# Patient Record
Sex: Male | Born: 1955 | Race: White | Hispanic: No | Marital: Married | Smoking: Current every day smoker
Health system: Southern US, Community
[De-identification: ages and names within clinical notes are randomized; demographics above are authoritative.]

## PROBLEM LIST (undated history)

## (undated) DIAGNOSIS — M1990 Unspecified osteoarthritis, unspecified site: Secondary | ICD-10-CM

## (undated) DIAGNOSIS — Z87891 Personal history of nicotine dependence: Principal | ICD-10-CM

## (undated) DIAGNOSIS — J439 Emphysema, unspecified: Secondary | ICD-10-CM

## (undated) DIAGNOSIS — M4800 Spinal stenosis, site unspecified: Secondary | ICD-10-CM

## (undated) HISTORY — PX: ESOPHAGOGASTRODUODENOSCOPY: SHX1529

## (undated) HISTORY — DX: Emphysema, unspecified: J43.9

## (undated) HISTORY — PX: BACK SURGERY: SHX140

## (undated) HISTORY — DX: Hemochromatosis, unspecified: E83.119

## (undated) HISTORY — DX: Unspecified osteoarthritis, unspecified site: M19.90

## (undated) HISTORY — DX: Spinal stenosis, site unspecified: M48.00

## (undated) HISTORY — DX: Personal history of nicotine dependence: Z87.891

## (undated) HISTORY — PX: COLONOSCOPY: SHX174

---

## 2000-02-17 ENCOUNTER — Emergency Department (HOSPITAL_COMMUNITY): Admission: EM | Admit: 2000-02-17 | Discharge: 2000-02-17 | Payer: Self-pay | Admitting: Emergency Medicine

## 2004-04-05 ENCOUNTER — Other Ambulatory Visit: Payer: Self-pay

## 2004-08-10 ENCOUNTER — Encounter: Payer: Self-pay | Admitting: Unknown Physician Specialty

## 2004-08-23 ENCOUNTER — Ambulatory Visit: Payer: Self-pay | Admitting: Internal Medicine

## 2004-09-05 ENCOUNTER — Encounter: Payer: Self-pay | Admitting: Unknown Physician Specialty

## 2004-09-05 ENCOUNTER — Ambulatory Visit: Payer: Self-pay | Admitting: Internal Medicine

## 2005-01-16 ENCOUNTER — Ambulatory Visit: Payer: Self-pay | Admitting: General Surgery

## 2005-01-21 ENCOUNTER — Ambulatory Visit: Payer: Self-pay | Admitting: Internal Medicine

## 2005-02-02 ENCOUNTER — Ambulatory Visit: Payer: Self-pay | Admitting: Internal Medicine

## 2005-03-05 ENCOUNTER — Ambulatory Visit: Payer: Self-pay | Admitting: Internal Medicine

## 2005-04-05 ENCOUNTER — Ambulatory Visit: Payer: Self-pay | Admitting: Internal Medicine

## 2005-05-05 ENCOUNTER — Ambulatory Visit: Payer: Self-pay | Admitting: Internal Medicine

## 2005-06-05 ENCOUNTER — Ambulatory Visit: Payer: Self-pay | Admitting: Internal Medicine

## 2005-07-05 ENCOUNTER — Ambulatory Visit: Payer: Self-pay | Admitting: Internal Medicine

## 2005-08-05 ENCOUNTER — Ambulatory Visit: Payer: Self-pay | Admitting: Internal Medicine

## 2005-09-05 ENCOUNTER — Ambulatory Visit: Payer: Self-pay | Admitting: Internal Medicine

## 2005-10-03 ENCOUNTER — Ambulatory Visit: Payer: Self-pay | Admitting: Internal Medicine

## 2005-11-03 ENCOUNTER — Ambulatory Visit: Payer: Self-pay | Admitting: Internal Medicine

## 2005-12-03 ENCOUNTER — Ambulatory Visit: Payer: Self-pay | Admitting: Internal Medicine

## 2006-01-03 ENCOUNTER — Ambulatory Visit: Payer: Self-pay | Admitting: Internal Medicine

## 2006-02-02 ENCOUNTER — Ambulatory Visit: Payer: Self-pay | Admitting: Internal Medicine

## 2006-02-02 ENCOUNTER — Emergency Department: Payer: Self-pay | Admitting: General Practice

## 2006-02-07 ENCOUNTER — Ambulatory Visit: Payer: Self-pay | Admitting: Physician Assistant

## 2006-03-05 ENCOUNTER — Ambulatory Visit: Payer: Self-pay | Admitting: Internal Medicine

## 2006-04-05 ENCOUNTER — Ambulatory Visit: Payer: Self-pay | Admitting: Internal Medicine

## 2006-05-05 ENCOUNTER — Ambulatory Visit: Payer: Self-pay | Admitting: Internal Medicine

## 2006-05-27 ENCOUNTER — Ambulatory Visit: Payer: Self-pay | Admitting: Cardiovascular Disease

## 2006-06-05 ENCOUNTER — Ambulatory Visit: Payer: Self-pay | Admitting: Internal Medicine

## 2006-06-10 ENCOUNTER — Inpatient Hospital Stay: Payer: Self-pay | Admitting: Otolaryngology

## 2006-06-10 ENCOUNTER — Other Ambulatory Visit: Payer: Self-pay

## 2006-07-05 ENCOUNTER — Ambulatory Visit: Payer: Self-pay | Admitting: Internal Medicine

## 2006-07-18 ENCOUNTER — Ambulatory Visit (HOSPITAL_COMMUNITY): Admission: RE | Admit: 2006-07-18 | Discharge: 2006-07-19 | Payer: Self-pay | Admitting: Otolaryngology

## 2006-07-19 ENCOUNTER — Ambulatory Visit (HOSPITAL_COMMUNITY): Admission: RE | Admit: 2006-07-19 | Discharge: 2006-07-19 | Payer: Self-pay | Admitting: Otolaryngology

## 2006-08-05 ENCOUNTER — Ambulatory Visit: Payer: Self-pay | Admitting: Internal Medicine

## 2006-09-05 ENCOUNTER — Ambulatory Visit: Payer: Self-pay | Admitting: Internal Medicine

## 2006-10-04 ENCOUNTER — Ambulatory Visit: Payer: Self-pay | Admitting: Internal Medicine

## 2006-11-04 ENCOUNTER — Ambulatory Visit: Payer: Self-pay | Admitting: Internal Medicine

## 2006-11-10 ENCOUNTER — Ambulatory Visit: Payer: Self-pay | Admitting: Internal Medicine

## 2006-12-04 ENCOUNTER — Ambulatory Visit: Payer: Self-pay | Admitting: Internal Medicine

## 2007-01-04 ENCOUNTER — Ambulatory Visit: Payer: Self-pay | Admitting: Internal Medicine

## 2007-02-03 ENCOUNTER — Ambulatory Visit: Payer: Self-pay | Admitting: Internal Medicine

## 2007-03-06 ENCOUNTER — Ambulatory Visit: Payer: Self-pay | Admitting: Internal Medicine

## 2007-03-09 ENCOUNTER — Ambulatory Visit: Payer: Self-pay | Admitting: Internal Medicine

## 2007-04-06 ENCOUNTER — Ambulatory Visit: Payer: Self-pay | Admitting: Internal Medicine

## 2007-05-06 ENCOUNTER — Ambulatory Visit: Payer: Self-pay | Admitting: Internal Medicine

## 2007-06-06 ENCOUNTER — Ambulatory Visit: Payer: Self-pay | Admitting: Internal Medicine

## 2007-06-11 ENCOUNTER — Ambulatory Visit: Payer: Self-pay | Admitting: Internal Medicine

## 2007-07-06 ENCOUNTER — Ambulatory Visit: Payer: Self-pay | Admitting: Internal Medicine

## 2007-08-06 ENCOUNTER — Ambulatory Visit: Payer: Self-pay | Admitting: Internal Medicine

## 2007-09-06 ENCOUNTER — Ambulatory Visit: Payer: Self-pay | Admitting: Internal Medicine

## 2007-10-04 ENCOUNTER — Ambulatory Visit: Payer: Self-pay | Admitting: Internal Medicine

## 2007-11-04 ENCOUNTER — Ambulatory Visit: Payer: Self-pay | Admitting: Internal Medicine

## 2007-12-04 ENCOUNTER — Ambulatory Visit: Payer: Self-pay | Admitting: Internal Medicine

## 2008-01-04 ENCOUNTER — Ambulatory Visit: Payer: Self-pay | Admitting: Internal Medicine

## 2008-02-03 ENCOUNTER — Ambulatory Visit: Payer: Self-pay | Admitting: Internal Medicine

## 2008-03-05 ENCOUNTER — Ambulatory Visit: Payer: Self-pay | Admitting: Internal Medicine

## 2008-04-05 ENCOUNTER — Ambulatory Visit: Payer: Self-pay | Admitting: Internal Medicine

## 2008-05-05 ENCOUNTER — Ambulatory Visit: Payer: Self-pay | Admitting: Internal Medicine

## 2008-06-05 ENCOUNTER — Ambulatory Visit: Payer: Self-pay | Admitting: Internal Medicine

## 2008-07-05 ENCOUNTER — Ambulatory Visit: Payer: Self-pay | Admitting: Internal Medicine

## 2008-08-05 ENCOUNTER — Ambulatory Visit: Payer: Self-pay | Admitting: Internal Medicine

## 2008-09-15 ENCOUNTER — Ambulatory Visit: Payer: Self-pay | Admitting: Internal Medicine

## 2008-10-03 ENCOUNTER — Ambulatory Visit: Payer: Self-pay | Admitting: Internal Medicine

## 2008-11-03 ENCOUNTER — Ambulatory Visit: Payer: Self-pay | Admitting: Internal Medicine

## 2008-12-03 ENCOUNTER — Ambulatory Visit: Payer: Self-pay | Admitting: Internal Medicine

## 2008-12-15 ENCOUNTER — Ambulatory Visit: Payer: Self-pay | Admitting: Internal Medicine

## 2009-01-03 ENCOUNTER — Ambulatory Visit: Payer: Self-pay | Admitting: Internal Medicine

## 2009-02-02 ENCOUNTER — Ambulatory Visit: Payer: Self-pay | Admitting: Internal Medicine

## 2009-03-05 ENCOUNTER — Ambulatory Visit: Payer: Self-pay | Admitting: Internal Medicine

## 2009-04-05 ENCOUNTER — Ambulatory Visit: Payer: Self-pay | Admitting: Internal Medicine

## 2009-04-13 ENCOUNTER — Ambulatory Visit: Payer: Self-pay | Admitting: Internal Medicine

## 2009-05-05 ENCOUNTER — Ambulatory Visit: Payer: Self-pay | Admitting: Internal Medicine

## 2009-06-05 ENCOUNTER — Ambulatory Visit: Payer: Self-pay | Admitting: Internal Medicine

## 2009-07-05 ENCOUNTER — Ambulatory Visit: Payer: Self-pay | Admitting: Internal Medicine

## 2009-08-05 ENCOUNTER — Ambulatory Visit: Payer: Self-pay | Admitting: Internal Medicine

## 2009-09-05 ENCOUNTER — Ambulatory Visit: Payer: Self-pay | Admitting: Internal Medicine

## 2009-10-31 ENCOUNTER — Ambulatory Visit: Payer: Self-pay | Admitting: Internal Medicine

## 2009-11-03 ENCOUNTER — Ambulatory Visit: Payer: Self-pay | Admitting: Internal Medicine

## 2009-12-03 ENCOUNTER — Ambulatory Visit: Payer: Self-pay | Admitting: Internal Medicine

## 2010-01-03 ENCOUNTER — Ambulatory Visit: Payer: Self-pay | Admitting: Internal Medicine

## 2010-01-06 ENCOUNTER — Emergency Department: Payer: Self-pay | Admitting: Emergency Medicine

## 2010-01-08 ENCOUNTER — Inpatient Hospital Stay: Payer: Self-pay | Admitting: Internal Medicine

## 2010-02-02 ENCOUNTER — Ambulatory Visit: Payer: Self-pay | Admitting: Internal Medicine

## 2010-04-05 ENCOUNTER — Ambulatory Visit: Payer: Self-pay | Admitting: Internal Medicine

## 2010-04-30 ENCOUNTER — Ambulatory Visit: Payer: Self-pay | Admitting: Unknown Physician Specialty

## 2010-05-05 ENCOUNTER — Ambulatory Visit: Payer: Self-pay | Admitting: Internal Medicine

## 2010-06-08 ENCOUNTER — Ambulatory Visit: Payer: Self-pay | Admitting: Internal Medicine

## 2010-07-05 ENCOUNTER — Ambulatory Visit: Payer: Self-pay | Admitting: Internal Medicine

## 2010-08-05 ENCOUNTER — Ambulatory Visit: Payer: Self-pay | Admitting: Internal Medicine

## 2010-09-05 ENCOUNTER — Ambulatory Visit: Payer: Self-pay | Admitting: Internal Medicine

## 2010-09-21 ENCOUNTER — Emergency Department: Payer: Self-pay | Admitting: Emergency Medicine

## 2010-10-04 ENCOUNTER — Ambulatory Visit: Payer: Self-pay | Admitting: Internal Medicine

## 2010-11-04 ENCOUNTER — Ambulatory Visit: Payer: Self-pay | Admitting: Internal Medicine

## 2010-12-04 ENCOUNTER — Ambulatory Visit: Payer: Self-pay | Admitting: Internal Medicine

## 2011-01-04 ENCOUNTER — Ambulatory Visit: Payer: Self-pay | Admitting: Internal Medicine

## 2011-02-15 ENCOUNTER — Ambulatory Visit: Payer: Self-pay | Admitting: Internal Medicine

## 2011-03-06 ENCOUNTER — Ambulatory Visit: Payer: Self-pay | Admitting: Internal Medicine

## 2011-04-06 ENCOUNTER — Ambulatory Visit: Payer: Self-pay | Admitting: Internal Medicine

## 2011-05-09 ENCOUNTER — Ambulatory Visit: Payer: Self-pay | Admitting: Internal Medicine

## 2011-06-06 ENCOUNTER — Ambulatory Visit: Payer: Self-pay | Admitting: Internal Medicine

## 2011-07-11 ENCOUNTER — Ambulatory Visit: Payer: Self-pay | Admitting: Internal Medicine

## 2011-08-06 ENCOUNTER — Ambulatory Visit: Payer: Self-pay | Admitting: Internal Medicine

## 2011-09-06 ENCOUNTER — Ambulatory Visit: Payer: Self-pay | Admitting: Internal Medicine

## 2011-09-06 LAB — CANCER CENTER HEMOGLOBIN: HGB: 13.1 g/dL (ref 13.0–18.0)

## 2011-10-04 ENCOUNTER — Ambulatory Visit: Payer: Self-pay | Admitting: Internal Medicine

## 2011-10-05 LAB — AFP TUMOR MARKER: AFP-Tumor Marker: 0.8 ng/mL (ref 0.0–8.3)

## 2011-11-04 ENCOUNTER — Ambulatory Visit: Payer: Self-pay | Admitting: Internal Medicine

## 2011-11-12 LAB — FERRITIN: Ferritin (ARMC): 10 ng/mL (ref 8–388)

## 2011-11-29 LAB — CBC CANCER CENTER
Basophil #: 0 "x10 3/mm "
Basophil %: 0.7 %
Eosinophil #: 0.2 "x10 3/mm "
Eosinophil %: 3.4 %
HCT: 38.3 % — ABNORMAL LOW
HGB: 12.5 g/dL — ABNORMAL LOW
Lymphocyte %: 39.6 %
Lymphs Abs: 2.3 "x10 3/mm "
MCH: 29.2 pg
MCHC: 32.6 g/dL
MCV: 90 fL
Monocyte #: 0.4 "x10 3/mm "
Monocyte %: 6.5 %
Neutrophil #: 2.9 "x10 3/mm "
Neutrophil %: 49.8 %
Platelet: 188 "x10 3/mm "
RBC: 4.28 "x10 6/mm " — ABNORMAL LOW
RDW: 15.7 % — ABNORMAL HIGH
WBC: 5.8 "x10 3/mm "

## 2011-12-04 ENCOUNTER — Ambulatory Visit: Payer: Self-pay | Admitting: Internal Medicine

## 2011-12-27 LAB — CBC CANCER CENTER
Basophil %: 0.5 %
HGB: 12.7 g/dL — ABNORMAL LOW (ref 13.0–18.0)
Monocyte %: 7.8 %
Neutrophil %: 41.1 %
RDW: 15.9 % — ABNORMAL HIGH (ref 11.5–14.5)

## 2012-01-04 ENCOUNTER — Ambulatory Visit: Payer: Self-pay | Admitting: Internal Medicine

## 2012-01-24 LAB — CANCER CENTER HEMOGLOBIN: HGB: 11.9 g/dL — ABNORMAL LOW (ref 13.0–18.0)

## 2012-01-24 LAB — FERRITIN: Ferritin (ARMC): 12 ng/mL (ref 8–388)

## 2012-02-03 ENCOUNTER — Ambulatory Visit: Payer: Self-pay | Admitting: Internal Medicine

## 2012-04-10 ENCOUNTER — Ambulatory Visit: Payer: Self-pay | Admitting: Internal Medicine

## 2012-04-10 LAB — FERRITIN: Ferritin (ARMC): 9 ng/mL (ref 8–388)

## 2012-05-05 ENCOUNTER — Ambulatory Visit: Payer: Self-pay | Admitting: Internal Medicine

## 2012-05-21 ENCOUNTER — Ambulatory Visit: Payer: Self-pay

## 2012-05-25 ENCOUNTER — Emergency Department: Payer: Self-pay | Admitting: Emergency Medicine

## 2012-05-25 LAB — COMPREHENSIVE METABOLIC PANEL
Alkaline Phosphatase: 96 U/L (ref 50–136)
Bilirubin,Total: 0.7 mg/dL (ref 0.2–1.0)
Co2: 24 mmol/L (ref 21–32)
Creatinine: 0.87 mg/dL (ref 0.60–1.30)
Osmolality: 278 (ref 275–301)
Sodium: 140 mmol/L (ref 136–145)

## 2012-05-25 LAB — CBC
HGB: 14.7 g/dL (ref 13.0–18.0)
MCHC: 34.6 g/dL (ref 32.0–36.0)
RDW: 16.7 % — ABNORMAL HIGH (ref 11.5–14.5)

## 2012-06-05 ENCOUNTER — Ambulatory Visit: Payer: Self-pay

## 2012-06-05 ENCOUNTER — Ambulatory Visit: Payer: Self-pay | Admitting: Internal Medicine

## 2012-06-05 LAB — FERRITIN: Ferritin (ARMC): 12 ng/mL (ref 8–388)

## 2012-06-29 ENCOUNTER — Ambulatory Visit: Payer: Self-pay | Admitting: Unknown Physician Specialty

## 2012-07-01 LAB — PATHOLOGY REPORT

## 2012-07-05 ENCOUNTER — Ambulatory Visit: Payer: Self-pay | Admitting: Internal Medicine

## 2012-07-17 LAB — CANCER CENTER HEMOGLOBIN: HGB: 13.3 g/dL (ref 13.0–18.0)

## 2012-08-05 ENCOUNTER — Ambulatory Visit: Payer: Self-pay | Admitting: Internal Medicine

## 2012-09-05 ENCOUNTER — Ambulatory Visit: Payer: Self-pay | Admitting: Internal Medicine

## 2012-10-03 ENCOUNTER — Ambulatory Visit: Payer: Self-pay | Admitting: Internal Medicine

## 2012-11-23 ENCOUNTER — Ambulatory Visit: Payer: Self-pay | Admitting: Internal Medicine

## 2012-11-26 LAB — FERRITIN: Ferritin (ARMC): 11 ng/mL (ref 8–388)

## 2012-11-26 LAB — CANCER CENTER HEMOGLOBIN: HGB: 12.7 g/dL — ABNORMAL LOW (ref 13.0–18.0)

## 2012-12-03 ENCOUNTER — Ambulatory Visit: Payer: Self-pay | Admitting: Internal Medicine

## 2013-01-03 ENCOUNTER — Ambulatory Visit: Payer: Self-pay | Admitting: Internal Medicine

## 2013-02-02 ENCOUNTER — Ambulatory Visit: Payer: Self-pay | Admitting: Internal Medicine

## 2013-03-05 ENCOUNTER — Ambulatory Visit: Payer: Self-pay | Admitting: Internal Medicine

## 2013-03-26 LAB — CANCER CENTER HEMOGLOBIN: HGB: 13.1 g/dL (ref 13.0–18.0)

## 2013-04-05 ENCOUNTER — Ambulatory Visit: Payer: Self-pay | Admitting: Internal Medicine

## 2013-05-07 ENCOUNTER — Ambulatory Visit: Payer: Self-pay | Admitting: Internal Medicine

## 2013-06-05 ENCOUNTER — Ambulatory Visit: Payer: Self-pay | Admitting: Internal Medicine

## 2013-06-18 LAB — CANCER CENTER HEMOGLOBIN: HGB: 13.3 g/dL (ref 13.0–18.0)

## 2013-06-18 IMAGING — CR DG KNEE COMPLETE 4+V*L*
1 series · 4 of 4 positions shown · non-contrast
Comparison: none

REASON FOR EXAM: pain, arthritis, Dr JIM.Kechyk [phone]
COMMENTS:

[Series 2: t knee ap left · 0.14mm/px · 4 of 4 slices shown]
[im 1/4]
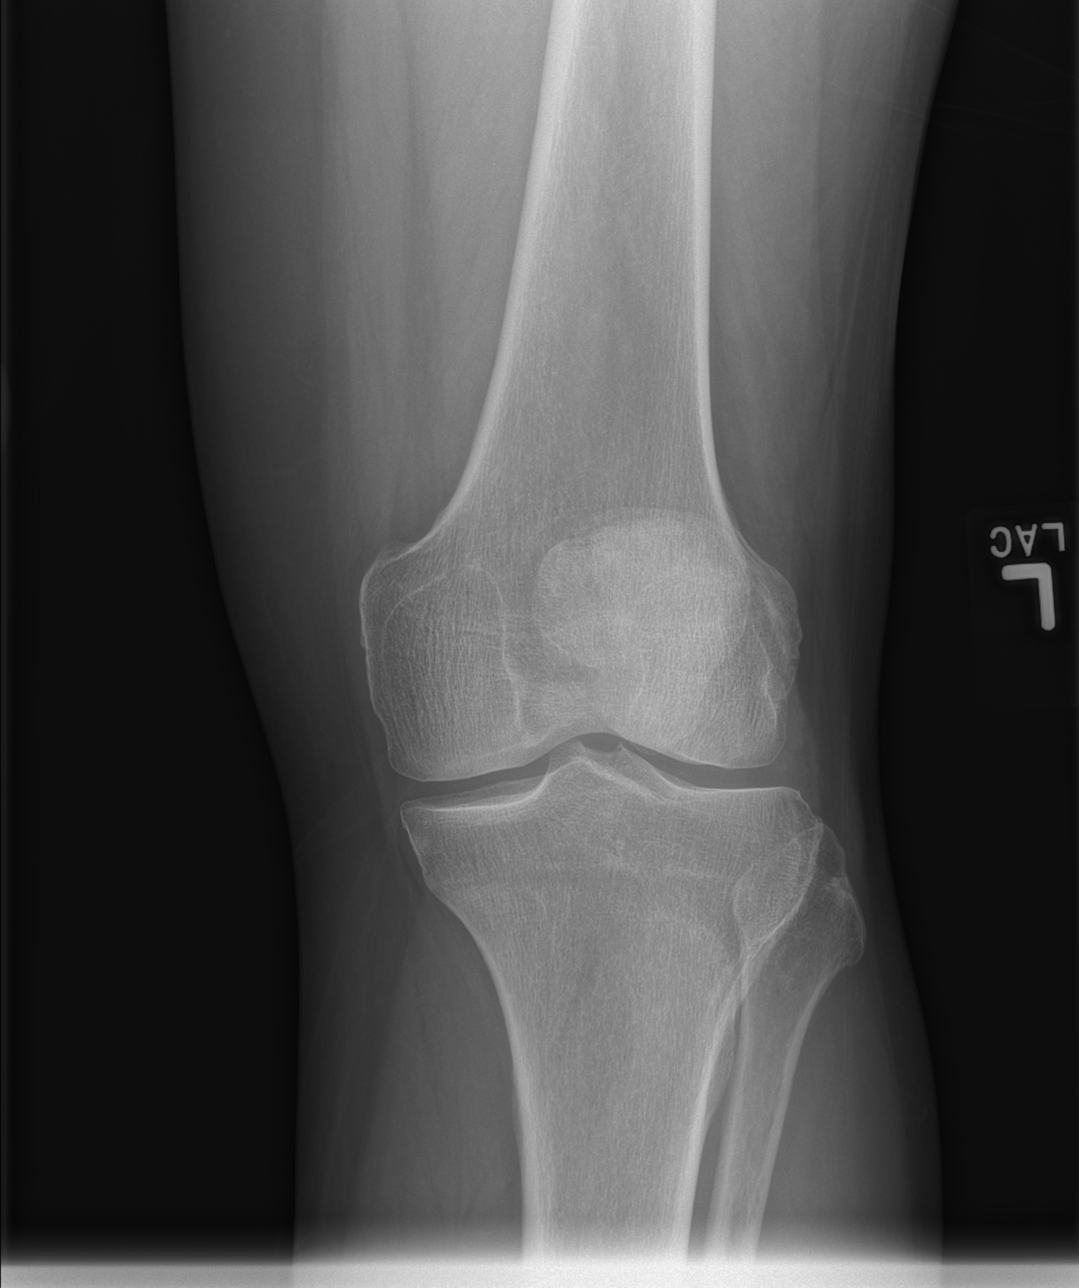
[im 2/4]
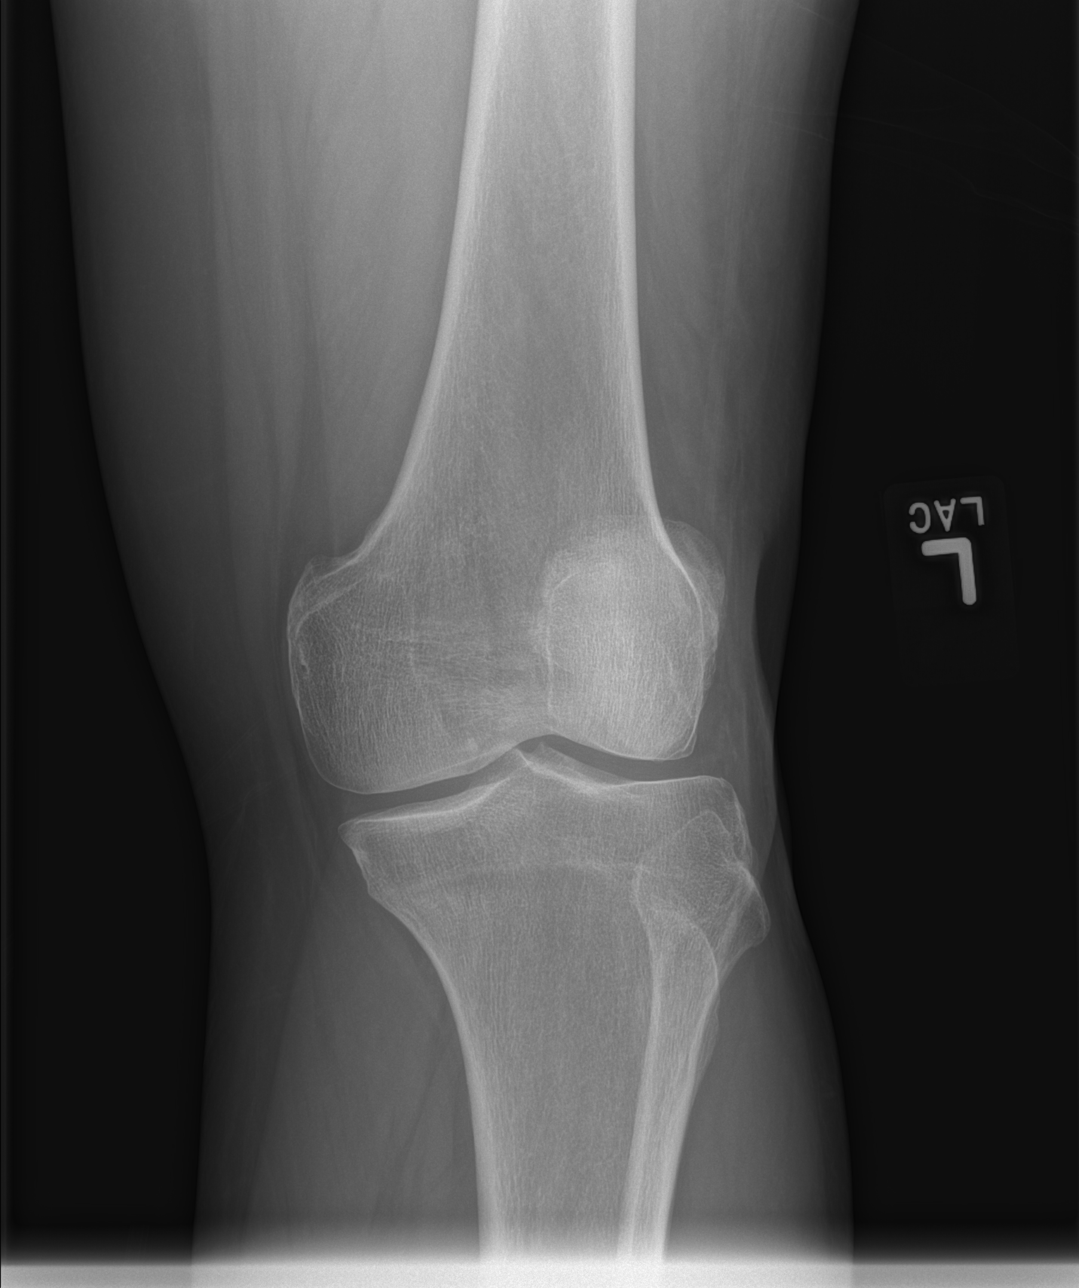
[im 3/4]
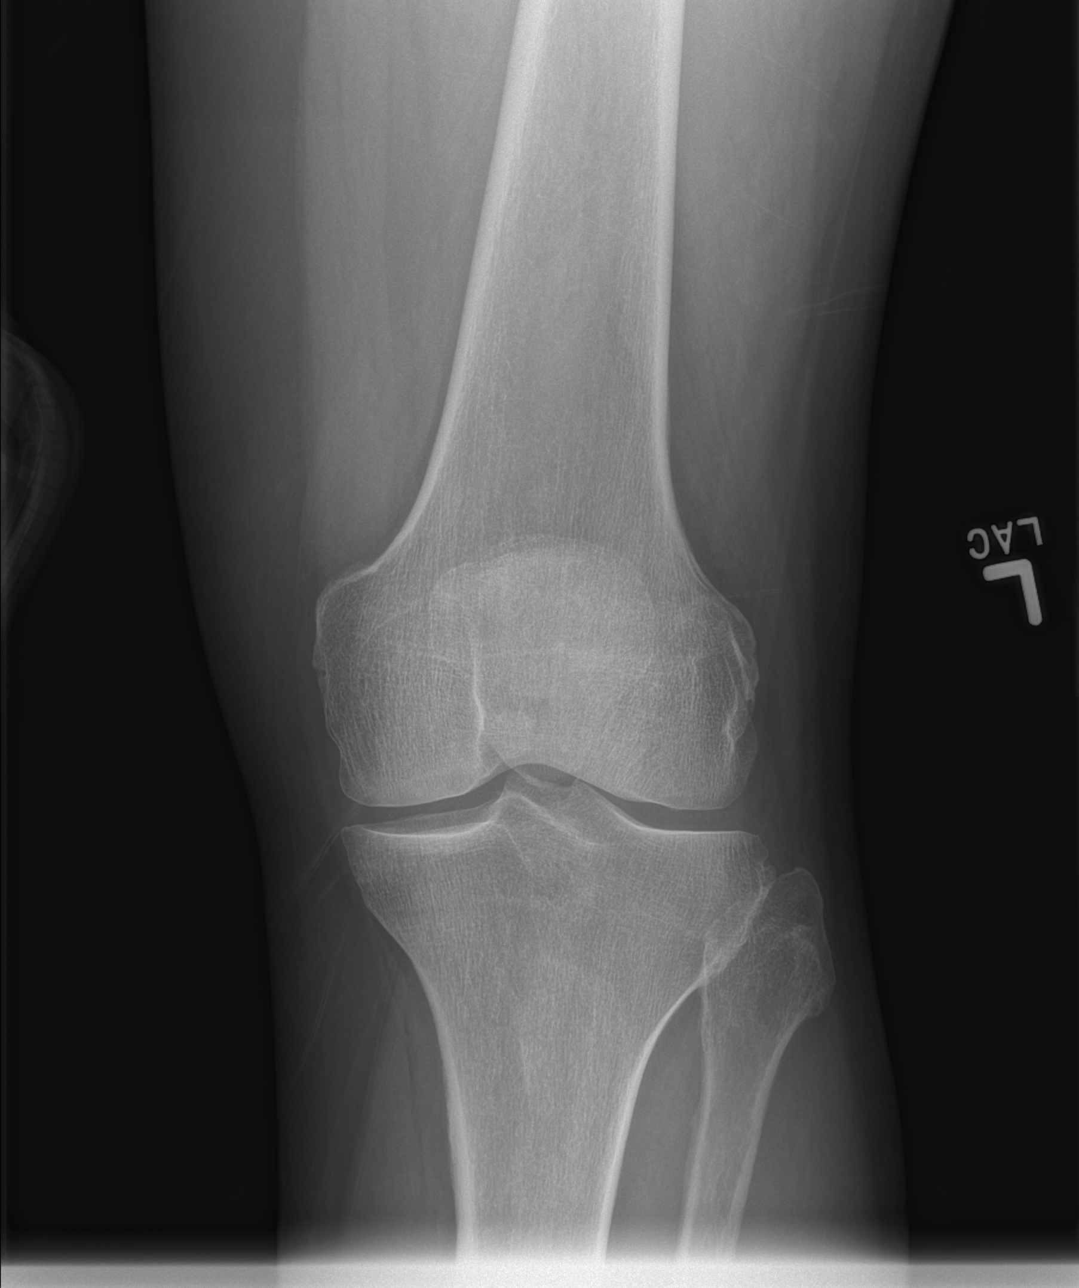
[im 4/4]
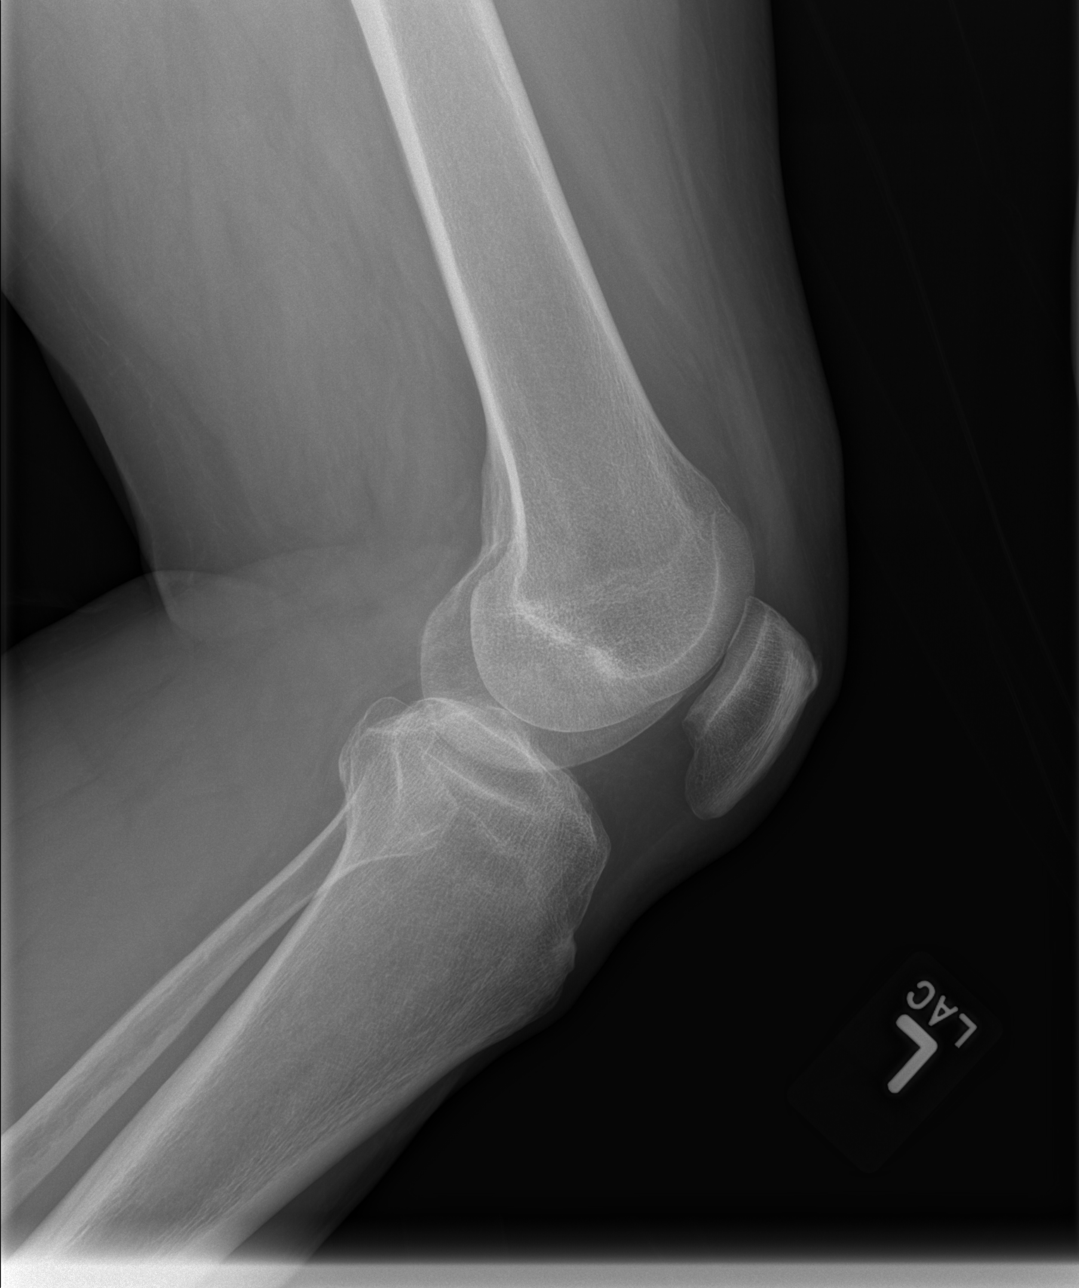

[4 of 4 positions shown; findings below may reference images not displayed]

PROCEDURE:     DXR - DXR KNEE LT COMP WITH OBLIQUES  - May 21, 2012 [DATE]

RESULT:     Four views of the left knee reveal the bones to be reasonably
well mineralized. There is minimal beaking of the tibial spines. The joint
spaces do not appear abnormally narrowed. No significant osteophyte
formation is demonstrated. The overlying soft tissues are normal in
appearance.
IMPRESSION: There is no acute bony abnormality of the left knee nor
evidence of more than minimal degenerative change.

If internal derangement is suspected, further evaluation with MRI would be a
useful next step.

[REDACTED]

## 2013-06-22 IMAGING — CR DG CHEST 1V
1 series · 1 of 1 positions shown · non-contrast
Comparison: none

REASON FOR EXAM: sob
COMMENTS:

[w chest pa]
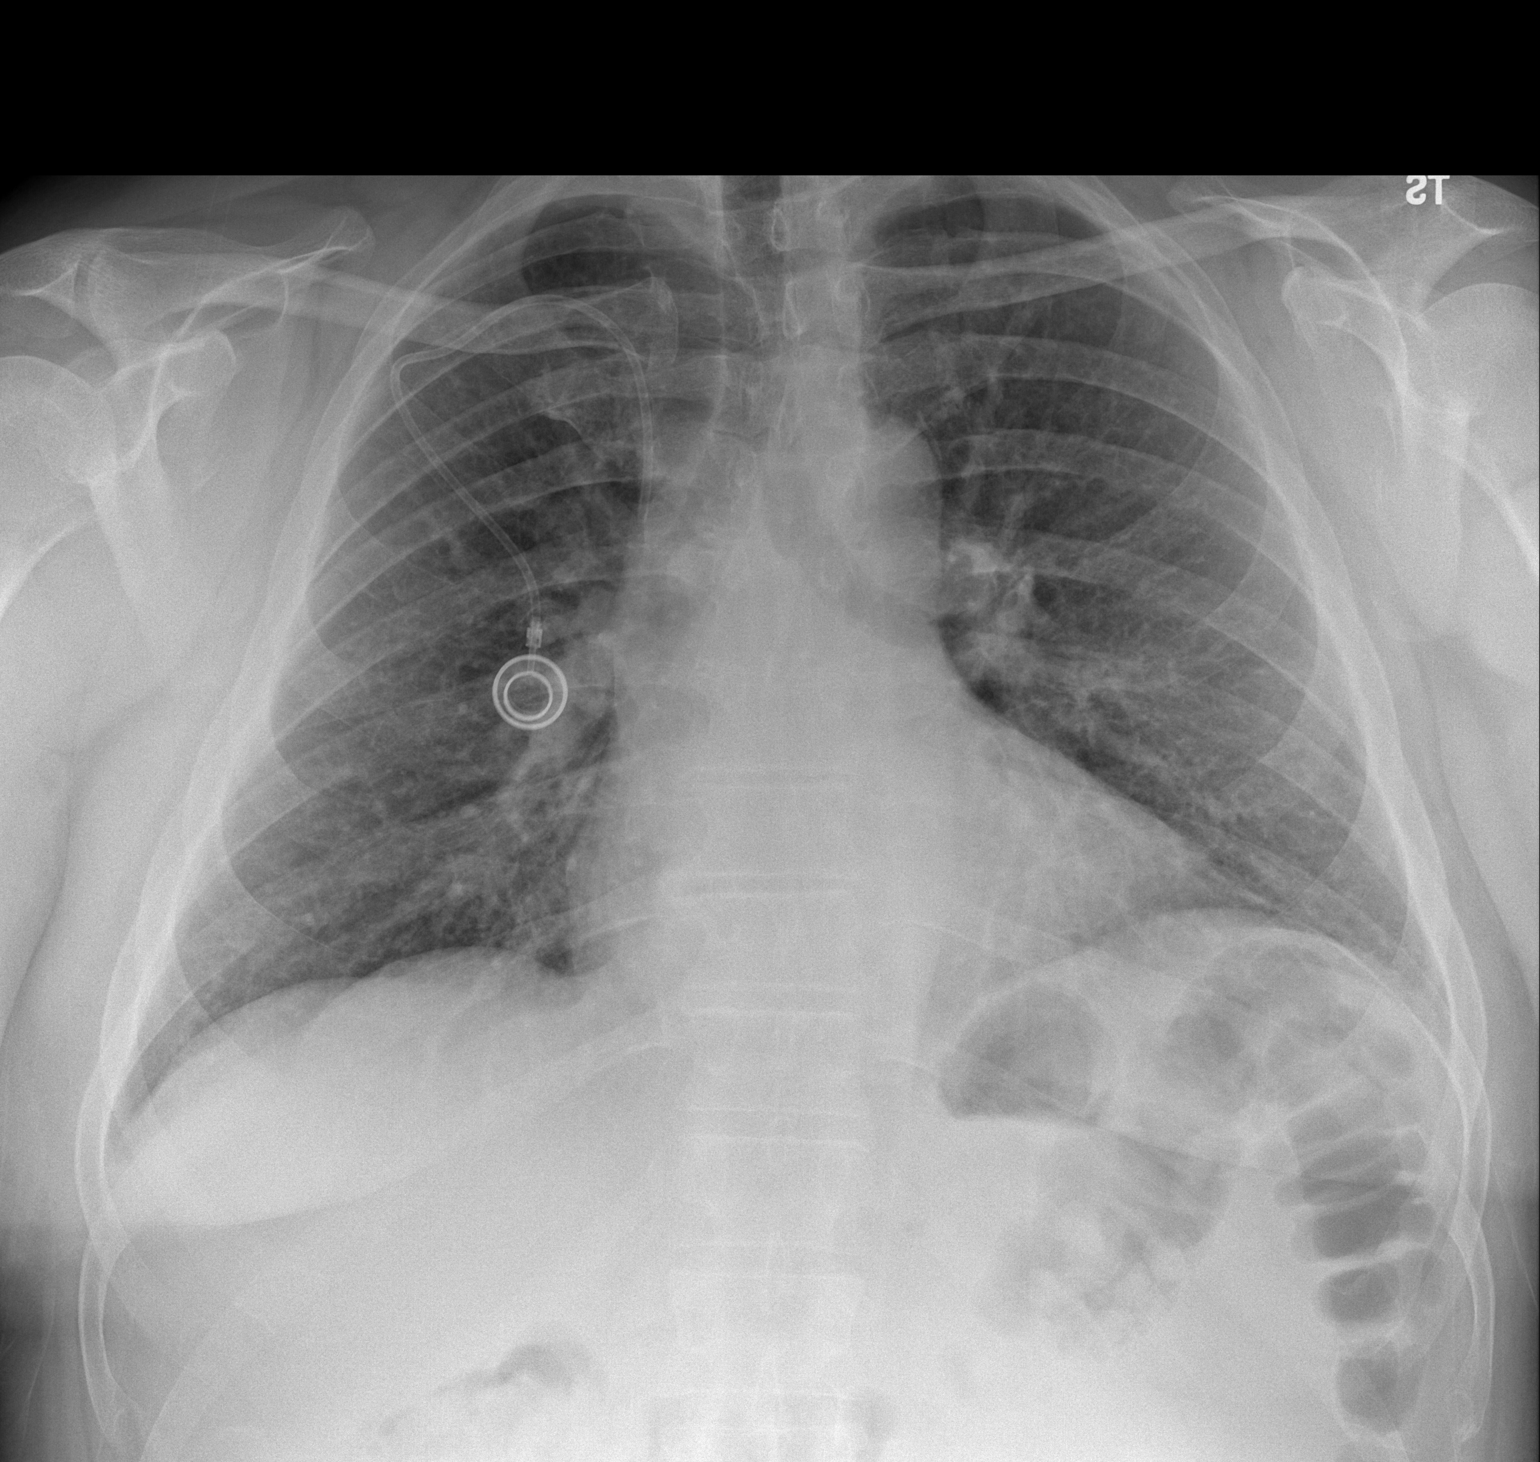

[1 of 1 positions shown; findings below may reference images not displayed]

PROCEDURE:     DXR - DXR CHEST 1 VIEWAP OR PA  - May 25, 2012  [DATE]

RESULT:     Comparison is made to the study January 08, 2010.

The lungs are adequately inflated. The interstitial markings are mildly
prominent. The cardiac silhouette is top normal in size. The pulmonary
vascularity is slightly more conspicuous today centrally. There is a
Port-A-Cath appliance in place. There is no pleural effusion or pneumothorax
or alveolar infiltrate.
IMPRESSION: Mildly increased interstitial markings may reflect
interstitial edema of cardiac or noncardiac cause. There is no focal
pneumonia.

[REDACTED]

## 2013-06-22 IMAGING — CT CT HEAD WITHOUT CONTRAST
2 series · 16 of 30 positions shown, 20 images · non-contrast
Comparison: none

REASON FOR EXAM: severe headache
COMMENTS:

PROCEDURE:     CT  - CT HEAD WITHOUT CONTRAST  - May 25, 2012  [DATE]
RESULT:     History: Headache.
Comparison Study: No recent.

[Series 2: without · axial · non-contrast · 0.42mm/px · z∈[+67,+187]mm · 13 of 30 slices shown, 17 images]
[im 3/30  brain]
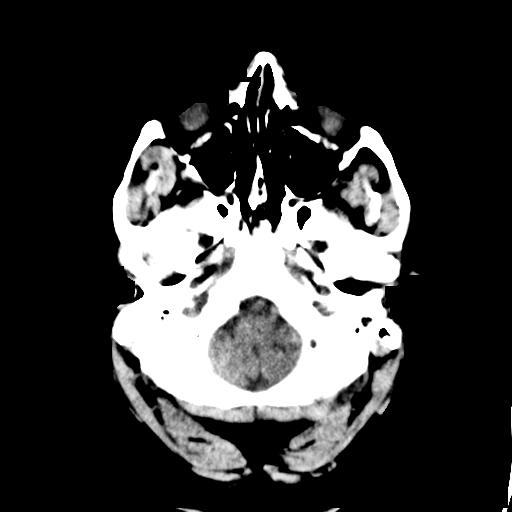
[im 3/30  bone]
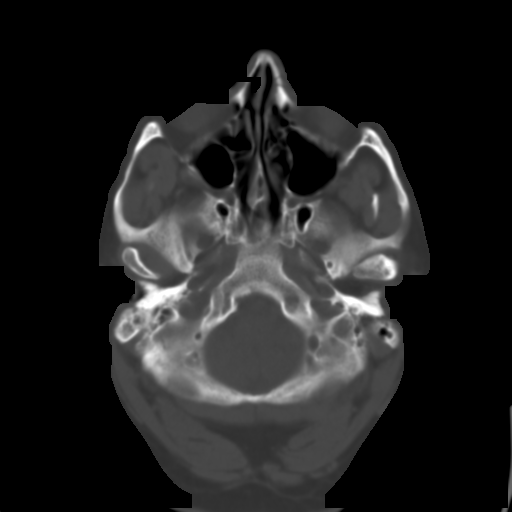
[im 5/30  brain]
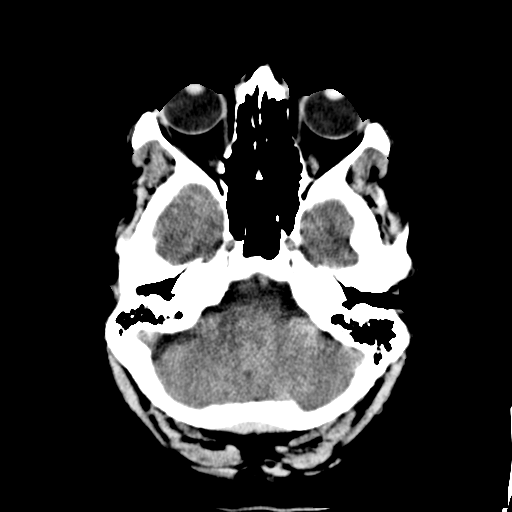
[im 7/30  brain]
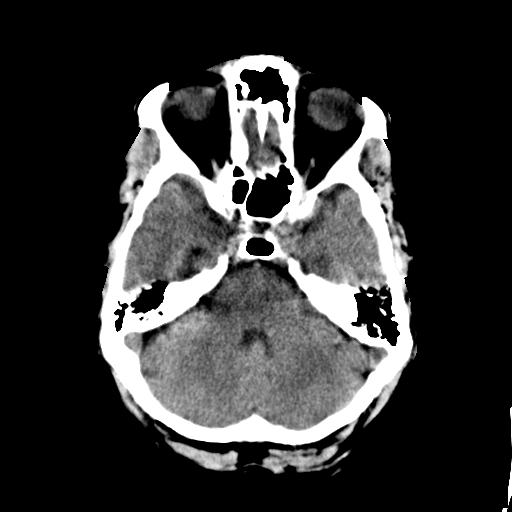
[im 9/30  brain]
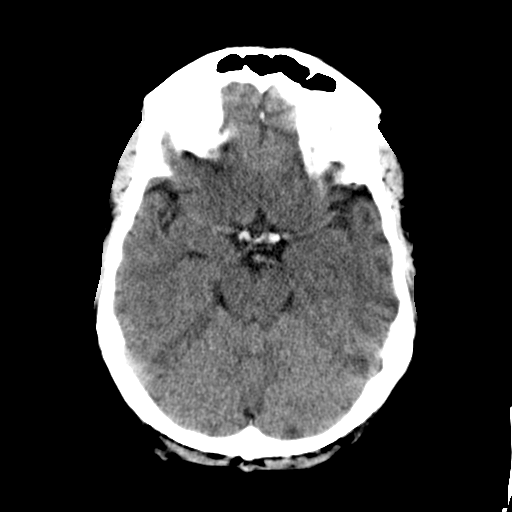
[im 11/30  brain]
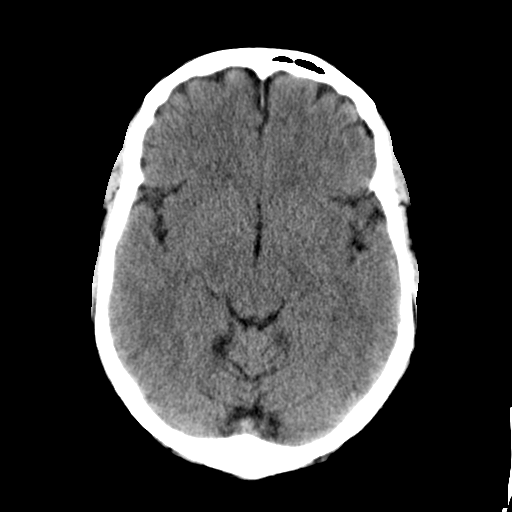
[im 11/30  bone]
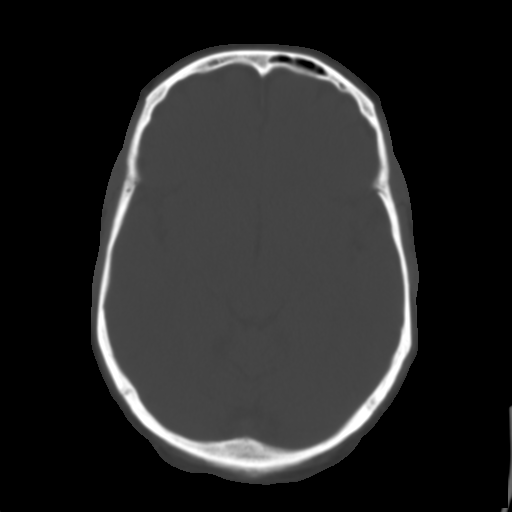
[im 13/30  brain]
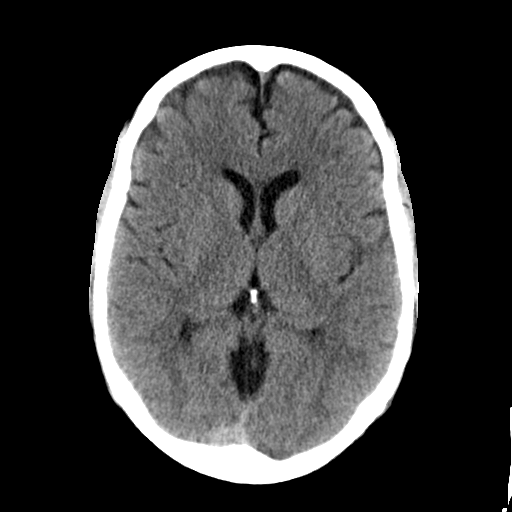
[im 15/30  brain]
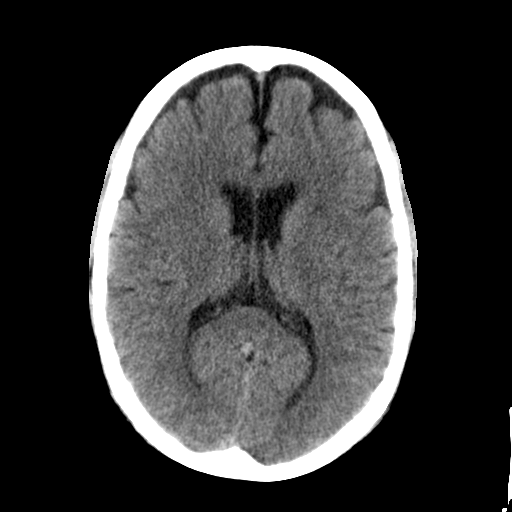
[im 17/30  brain]
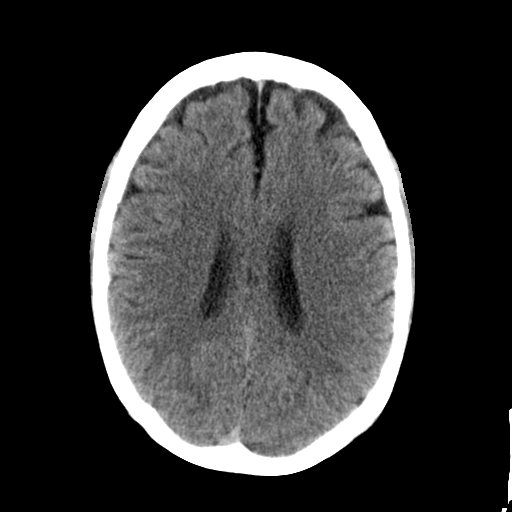
[im 19/30  brain]
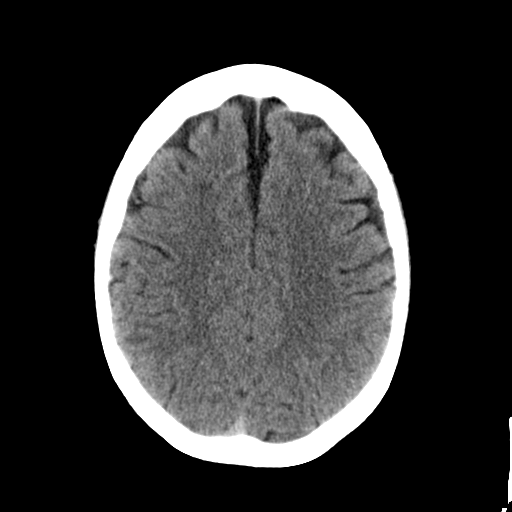
[im 19/30  bone]
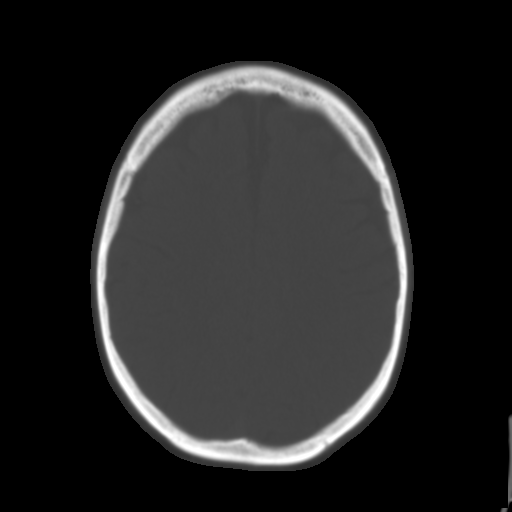
[im 21/30  brain]
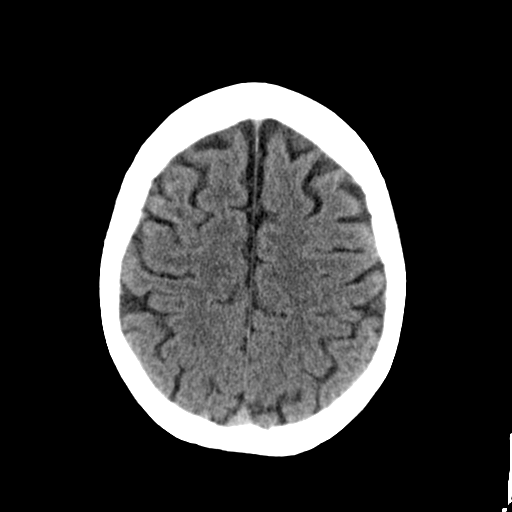
[im 23/30  brain]
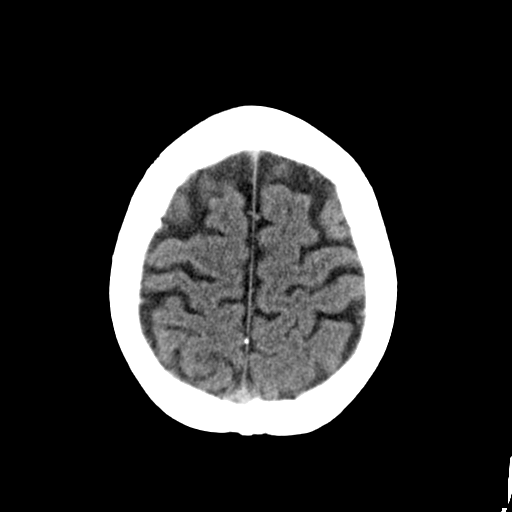
[im 25/30  brain]
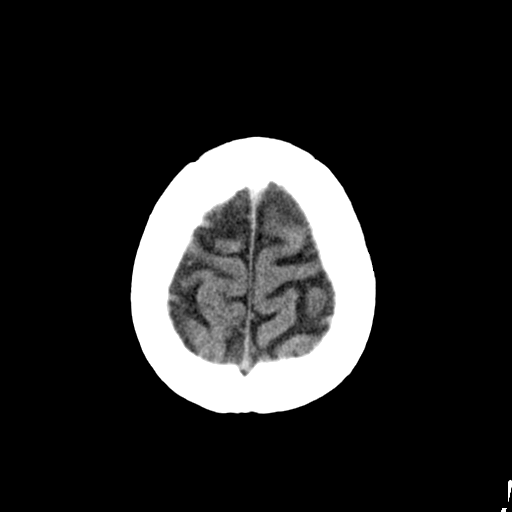
[im 27/30  brain]
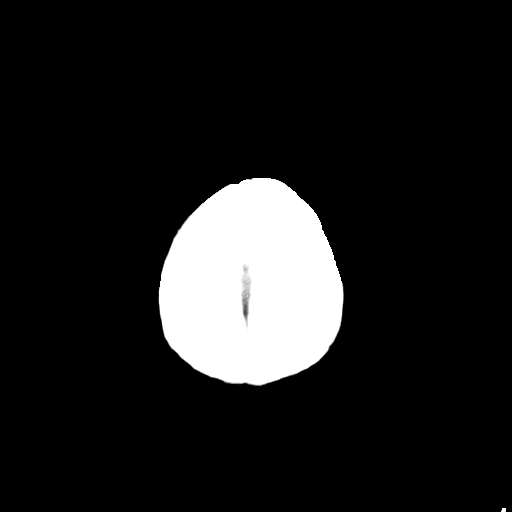
[im 27/30  bone]
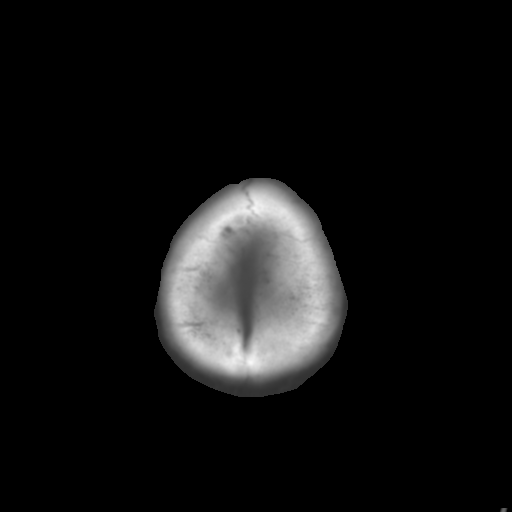

[Series 3: bone · axial · 0.42mm/px · z∈[+67,+107]mm · 3 of 30 slices shown]
[im 3/30  bone]
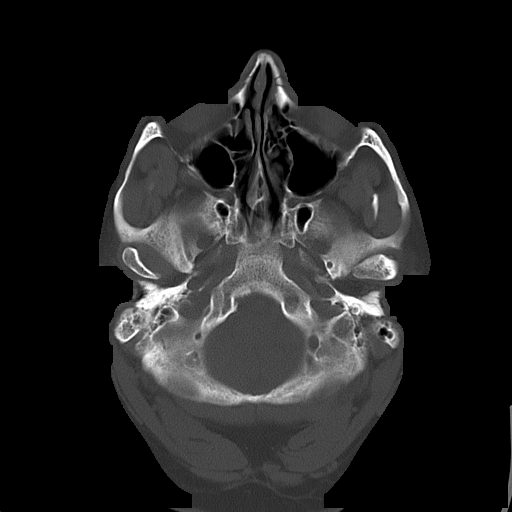
[im 7/30  bone]
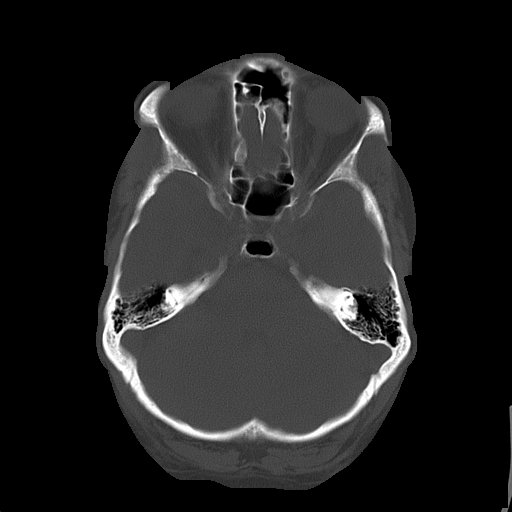
[im 11/30  bone]
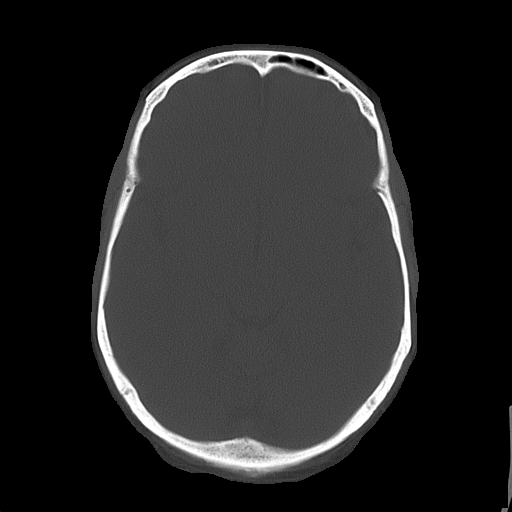

[16 of 30 positions shown; findings below may reference images not displayed]

FINDINGS: Standard nonenhanced CT obtained. No mass. No hydrocephalus. No
hemorrhage. No acute bony abnormality.
IMPRESSION: No acute abnormality.

## 2013-07-05 ENCOUNTER — Ambulatory Visit: Payer: Self-pay | Admitting: Internal Medicine

## 2013-09-06 ENCOUNTER — Ambulatory Visit: Payer: Self-pay | Admitting: Internal Medicine

## 2013-09-06 LAB — CREATININE, SERUM
Creatinine: 0.98 mg/dL (ref 0.60–1.30)
EGFR (Non-African Amer.): >60

## 2013-09-06 LAB — CANCER CENTER HEMOGLOBIN: HGB: 13.8 g/dL (ref 13.0–18.0)

## 2013-09-06 LAB — FERRITIN: Ferritin (ARMC): 13 ng/mL (ref 8–388)

## 2013-10-03 ENCOUNTER — Ambulatory Visit: Payer: Self-pay | Admitting: Internal Medicine

## 2013-11-03 ENCOUNTER — Ambulatory Visit: Payer: Self-pay | Admitting: Internal Medicine

## 2013-12-10 ENCOUNTER — Ambulatory Visit: Payer: Self-pay | Admitting: Internal Medicine

## 2013-12-13 LAB — CANCER CENTER HEMOGLOBIN: HGB: 13.1 g/dL (ref 13.0–18.0)

## 2013-12-13 LAB — FERRITIN: FERRITIN (ARMC): 16 ng/mL (ref 8–388)

## 2014-01-03 ENCOUNTER — Ambulatory Visit: Payer: Self-pay | Admitting: Internal Medicine

## 2014-03-07 ENCOUNTER — Ambulatory Visit: Payer: Self-pay | Admitting: Internal Medicine

## 2014-03-07 LAB — FERRITIN: FERRITIN (ARMC): 15 ng/mL (ref 8–388)

## 2014-03-07 LAB — CANCER CENTER HEMOGLOBIN: HGB: 12.6 g/dL — ABNORMAL LOW (ref 13.0–18.0)

## 2014-03-11 ENCOUNTER — Ambulatory Visit: Payer: Self-pay | Admitting: Family Medicine

## 2014-04-05 ENCOUNTER — Ambulatory Visit: Payer: Self-pay | Admitting: Internal Medicine

## 2014-04-19 LAB — AFP TUMOR MARKER: AFP TUMOR MARKER: 1.7 ng/mL (ref 0.0–8.3)

## 2014-05-05 ENCOUNTER — Ambulatory Visit: Payer: Self-pay | Admitting: Internal Medicine

## 2014-08-12 ENCOUNTER — Ambulatory Visit: Payer: Self-pay | Admitting: Internal Medicine

## 2014-08-12 LAB — URINALYSIS, COMPLETE
Bacteria: NONE SEEN
Bilirubin,UR: NEGATIVE
Blood: NEGATIVE
Glucose,UR: NEGATIVE mg/dL (ref 0–75)
Ketone: NEGATIVE
Nitrite: NEGATIVE
PH: 6 (ref 4.5–8.0)
PROTEIN: NEGATIVE
SPECIFIC GRAVITY: 1.012 (ref 1.003–1.030)
Squamous Epithelial: NONE SEEN
WBC UR: 8 /HPF (ref 0–5)

## 2014-08-12 LAB — FERRITIN: Ferritin (ARMC): 16 ng/mL (ref 8–388)

## 2014-08-12 LAB — CANCER CENTER HEMOGLOBIN: HGB: 12.9 g/dL — ABNORMAL LOW (ref 13.0–18.0)

## 2014-09-05 ENCOUNTER — Ambulatory Visit: Payer: Self-pay | Admitting: Internal Medicine

## 2014-12-08 ENCOUNTER — Other Ambulatory Visit: Payer: Self-pay | Admitting: *Deleted

## 2014-12-08 NOTE — Telephone Encounter (Signed)
Needs refill on Warfarin

## 2014-12-16 ENCOUNTER — Other Ambulatory Visit: Payer: Self-pay

## 2014-12-16 ENCOUNTER — Ambulatory Visit: Payer: Self-pay | Admitting: Internal Medicine

## 2014-12-23 ENCOUNTER — Other Ambulatory Visit: Payer: Self-pay | Admitting: *Deleted

## 2014-12-23 ENCOUNTER — Inpatient Hospital Stay: Payer: Medicare Other

## 2014-12-23 ENCOUNTER — Inpatient Hospital Stay: Payer: Medicare Other | Attending: Internal Medicine

## 2014-12-23 ENCOUNTER — Inpatient Hospital Stay: Payer: Medicare Other | Admitting: Internal Medicine

## 2015-02-03 ENCOUNTER — Inpatient Hospital Stay: Payer: Medicare Other | Attending: Family Medicine

## 2015-02-03 ENCOUNTER — Inpatient Hospital Stay: Payer: Medicare Other

## 2015-03-17 ENCOUNTER — Inpatient Hospital Stay: Payer: Medicare Other | Attending: Family Medicine

## 2015-03-17 ENCOUNTER — Inpatient Hospital Stay: Payer: Medicare Other | Admitting: Family Medicine

## 2015-03-17 ENCOUNTER — Inpatient Hospital Stay: Payer: Medicare Other

## 2015-03-29 ENCOUNTER — Telehealth: Payer: Self-pay | Admitting: *Deleted

## 2015-03-29 NOTE — Telephone Encounter (Signed)
Attempted to leave voicemail for patient notifyng them that it is time to schedule annual low dose lung cancer screening CT scan. However the patient does not have voicemail set up and I will follow up at a later date.

## 2015-03-31 ENCOUNTER — Telehealth: Payer: Self-pay

## 2015-03-31 NOTE — Telephone Encounter (Signed)
Navigator Encounter Type: Telephone, Screening  Notified patient that annual lung cancer screening low dose CT scan is due. Confirmed that patient is within the age range 25-77 and asymptomatic. The patient is a smoker with a 92 pkyr history. Shared Decision Making Visit was done on 03/11/2014. Patient is agreeable to scheduling of CT scan.

## 2015-04-04 ENCOUNTER — Inpatient Hospital Stay: Payer: Medicare Other

## 2015-04-17 ENCOUNTER — Encounter: Payer: Self-pay | Admitting: Family Medicine

## 2015-04-17 ENCOUNTER — Other Ambulatory Visit: Payer: Self-pay | Admitting: Family Medicine

## 2015-04-17 DIAGNOSIS — Z87891 Personal history of nicotine dependence: Secondary | ICD-10-CM

## 2015-04-17 HISTORY — DX: Personal history of nicotine dependence: Z87.891

## 2015-04-21 ENCOUNTER — Ambulatory Visit: Admission: RE | Admit: 2015-04-21 | Payer: Medicare Other | Source: Ambulatory Visit

## 2015-04-28 ENCOUNTER — Inpatient Hospital Stay: Payer: Medicare Other | Attending: Family Medicine

## 2015-04-28 ENCOUNTER — Inpatient Hospital Stay: Payer: Medicare Other

## 2015-07-04 ENCOUNTER — Telehealth: Payer: Self-pay | Admitting: *Deleted

## 2015-07-04 NOTE — Telephone Encounter (Signed)
md agrees 

## 2015-07-04 NOTE — Telephone Encounter (Signed)
Called to report that he is back in town, but leaving tomorrow [facility] [place] [facility] been for the past 4 months and was asking that he get his warfarin 1 mg refilled and port flushed. He states he was told he could "come by any time" he is in town to get it flushed. He states it has not been flushed in 4 months and that he takes 1 mg of warfarin to keep his port from clotting off. I explained that He has been a no show for numerous appts, we have no current labs on him, and that Dr Lorre NickGittin is no longer here and he would have to see a doctor here to establish care and offered him an appt. He stated he will see someone when he returns in January and asked for something on the 23rd. He has agreed to an appt  08/28/15 @ 2:00

## 2015-08-25 ENCOUNTER — Other Ambulatory Visit: Payer: Self-pay | Admitting: *Deleted

## 2015-08-28 ENCOUNTER — Inpatient Hospital Stay: Payer: Medicare Other | Attending: Internal Medicine

## 2015-08-28 ENCOUNTER — Inpatient Hospital Stay: Payer: Medicare Other | Admitting: Internal Medicine

## 2015-08-28 ENCOUNTER — Inpatient Hospital Stay: Payer: Medicare Other

## 2015-08-28 DIAGNOSIS — Z95828 Presence of other vascular implants and grafts: Secondary | ICD-10-CM

## 2015-08-28 DIAGNOSIS — M4800 Spinal stenosis, site unspecified: Secondary | ICD-10-CM | POA: Diagnosis not present

## 2015-08-28 DIAGNOSIS — M1990 Unspecified osteoarthritis, unspecified site: Secondary | ICD-10-CM | POA: Diagnosis not present

## 2015-08-28 DIAGNOSIS — F17210 Nicotine dependence, cigarettes, uncomplicated: Secondary | ICD-10-CM | POA: Diagnosis not present

## 2015-08-28 DIAGNOSIS — Z7982 Long term (current) use of aspirin: Secondary | ICD-10-CM | POA: Diagnosis not present

## 2015-08-28 DIAGNOSIS — J439 Emphysema, unspecified: Secondary | ICD-10-CM | POA: Diagnosis not present

## 2015-08-28 DIAGNOSIS — Z79899 Other long term (current) drug therapy: Secondary | ICD-10-CM | POA: Diagnosis not present

## 2015-08-28 LAB — CBC WITH DIFFERENTIAL/PLATELET
BASOS ABS: 0.2 10*3/uL — AB (ref 0–0.1)
Basophils Relative: 3 %
EOS ABS: 0.1 10*3/uL (ref 0–0.7)
Eosinophils Relative: 2 %
HCT: 41 % (ref 40.0–52.0)
HEMOGLOBIN: 13.9 g/dL (ref 13.0–18.0)
LYMPHS PCT: 43 %
Lymphs Abs: 2.9 10*3/uL (ref 1.0–3.6)
MCH: 30.3 pg (ref 26.0–34.0)
MCHC: 33.9 g/dL (ref 32.0–36.0)
MCV: 89.6 fL (ref 80.0–100.0)
Monocytes Absolute: 0.3 10*3/uL (ref 0.2–1.0)
Monocytes Relative: 5 %
NEUTROS ABS: 3.2 10*3/uL (ref 1.4–6.5)
NEUTROS PCT: 47 %
PLATELETS: 255 10*3/uL (ref 150–440)
RBC: 4.57 MIL/uL (ref 4.40–5.90)
RDW: 15.2 % — ABNORMAL HIGH (ref 11.5–14.5)
WBC: 6.7 10*3/uL (ref 3.8–10.6)

## 2015-08-28 LAB — FERRITIN: FERRITIN: 17 ng/mL — AB (ref 24–336)

## 2015-08-28 MED ORDER — SODIUM CHLORIDE 0.9 % IJ SOLN
10.0000 mL | INTRAMUSCULAR | Status: DC | PRN
  Administered 2015-08-28: 10 mL via INTRAVENOUS
  Filled 2015-08-28: qty 10

## 2015-08-28 MED ORDER — HEPARIN SOD (PORK) LOCK FLUSH 100 UNIT/ML IV SOLN
500.0000 [IU] | Freq: Once | INTRAVENOUS | Status: AC
  Administered 2015-08-28: 500 [IU] via INTRAVENOUS

## 2015-08-29 ENCOUNTER — Telehealth: Payer: Self-pay | Admitting: Internal Medicine

## 2015-08-29 ENCOUNTER — Inpatient Hospital Stay (HOSPITAL_BASED_OUTPATIENT_CLINIC_OR_DEPARTMENT_OTHER): Payer: Medicare Other | Admitting: Internal Medicine

## 2015-08-29 ENCOUNTER — Encounter: Payer: Self-pay | Admitting: Internal Medicine

## 2015-08-29 DIAGNOSIS — Z79899 Other long term (current) drug therapy: Secondary | ICD-10-CM | POA: Diagnosis not present

## 2015-08-29 DIAGNOSIS — F17210 Nicotine dependence, cigarettes, uncomplicated: Secondary | ICD-10-CM | POA: Diagnosis not present

## 2015-08-29 MED ORDER — WARFARIN SODIUM 1 MG PO TABS
1.0000 mg | ORAL_TABLET | Freq: Every day | ORAL | Status: DC
Start: 2015-08-29 — End: 2015-12-26

## 2015-08-29 MED ORDER — WARFARIN SODIUM 1 MG PO TABS: 1.0000 mg | ORAL_TABLET | Freq: Every day | ORAL | Status: DC

## 2015-08-29 NOTE — Telephone Encounter (Signed)
Patient did not want me to schedule any additional appointments until he is sure when he will be here again from [place]. I gave him our card and he said he will call as soon as he knows what day he will be available. No FU currently scheduled for this reason.

## 2015-08-29 NOTE — Progress Notes (Signed)
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 I reviewed the patient's prior charts/pertinent labs/imaging in detail; findings are summarized above.   60 year old male patient with above history of hereditary hemochromatosis who has been getting periodic phlebotomies is here for follow-up. Patient continues to have chronic arthritic symptoms/this is fairly unchanged.  Patient denies any bleeding issues. Appetite is good. Not losing any weight. No unusual cough or shortness of breath or chest pain. Patient is planning to travel to [place] for the rest of the winter today.   REVIEW OF SYSTEMS:  A complete 10 point review of system is done which is negative except mentioned above/history of present illness.   PAST MEDICAL HISTORY :  Past Medical History  Diagnosis Date  . Hemochromatosis   . Arthritis   . Emphysema of lung (HCC)   . Spinal stenosis   . Personal history of tobacco use, presenting hazards to health 04/17/2015    PAST SURGICAL HISTORY :   Past Surgical History  Procedure Laterality Date  . Back surgery    . Esophagogastroduodenoscopy  05-24-2010; 06-24-2012  . Colonoscopy  05-24-2010; 06-24-2012    FAMILY HISTORY :  No family history on file.  SOCIAL HISTORY:   Social History  Substance Use Topics  . Smoking status: Current Every Day Smoker -- 2.00 packs/day for 50 years    Types: Cigarettes  . Smokeless tobacco: Not on file  . Alcohol Use: No    ALLERGIES:  is allergic to no known allergies.  MEDICATIONS:   Current Outpatient Prescriptions  Medication Sig Dispense Refill  . acetaminophen (TYLENOL) 500 MG tablet Take 500 mg by mouth every 6 (six) hours as needed.    Marland Kitchen albuterol-ipratropium (COMBIVENT) 18-103 MCG/ACT inhaler Inhale 1 puff into the lungs 2 (two) times daily.    Marland Kitchen alprazolam (NIRAVAM) 2 MG dissolvable tablet Take 2 mg by mouth 3 (three) times daily.    Marland Kitchen aspirin 81 MG tablet Take 81 mg by mouth daily.    . Cholecalciferol 5000 UNITS TABS Take 1 tablet by mouth daily.    . citalopram (CELEXA) 40 MG tablet Take 40 mg by mouth 2 (two) times daily.    . Fluticasone-Salmeterol (ADVAIR) 250-50 MCG/DOSE AEPB Inhale 1 puff into the lungs 2 (two) times daily.    Marland Kitchen gabapentin (NEURONTIN) 600 MG tablet Take 600 mg by mouth 4 (four) times daily.    Marland Kitchen levalbuterol (XOPENEX HFA) 45 MCG/ACT inhaler Inhale into the lungs.    . metFORMIN (GLUCOPHAGE) 500 MG tablet Take 500 mg by mouth 2 (two) times daily with a meal.    . methadone (DOLOPHINE) 10 MG tablet Take 10 mg by mouth every 6 (six) hours as needed.    . pantoprazole (PROTONIX) 40 MG tablet Take 40 mg by mouth 2 (two) times daily.    . Pseudoephedrine-Ibuprofen 30-200 MG CAPS Take 2 capsules by mouth every 4 (four) hours as needed.    . simvastatin (ZOCOR) 20 MG tablet Take 20 mg by mouth daily.    Marland Kitchen warfarin (COUMADIN) 1 MG tablet Take 1 tablet (1  mg total) by mouth daily. 30 tablet 3  . warfarin (COUMADIN) 1 MG tablet Take 1 tablet (1 mg total) by mouth daily. 30 tablet 3  . zolpidem (AMBIEN) 10 MG tablet Take 10 mg by mouth at bedtime as needed for sleep.    Marland Kitchen oxyCODONE (OXY IR/ROXICODONE) 5 MG immediate release tablet Take 10 mg by mouth every 4 (four) hours as needed for severe pain.      No current facility-administered medications for this visit.    PHYSICAL EXAMINATION:   BP 140/81 mmHg  Pulse 75  Temp(Src) 98.3 F (36.8 C) (Oral)  Ht 5' 10.5" (1.791 m)  Wt 256 lb 2.8 oz (116.2 kg)  BMI 36.23 kg/m2  Filed Weights    08/29/15 0906  Weight: 256 lb 2.8 oz (116.2 kg)    GENERAL: Well-nourished well-developed; Alert, no distress and comfortable.    EYES: no pallor or icterus OROPHARYNX: no thrush or ulceration; good dentition  NECK: supple, no masses felt LYMPH:  no palpable lymphadenopathy in the cervical, axillary or inguinal regions LUNGS: clear to auscultation and  No wheeze or crackles HEART/CVS: regular rate & rhythm and no murmurs; No lower extremity edema ABDOMEN:abdomen soft, non-tender and normal bowel sounds Musculoskeletal:no cyanosis of digits and no clubbing  PSYCH: alert & oriented x 3 with fluent speech NEURO: no focal motor/sensory deficits SKIN:  no rashes or significant lesions  LABORATORY DATA:  I have reviewed the data as listed    Component Value Date/Time   NA 140 05/25/2012 2236   K 4.0 05/25/2012 2236   CL 105 05/25/2012 2236   CO2 24 05/25/2012 2236   GLUCOSE 92 05/25/2012 2236   BUN 9 05/25/2012 2236   CREATININE 0.98 09/06/2013 1405   CALCIUM 8.9 05/25/2012 2236   PROT 8.0 05/25/2012 2236   ALBUMIN 4.0 05/25/2012 2236   AST 21 05/25/2012 2236   ALT 29 05/25/2012 2236   ALKPHOS 96 05/25/2012 2236   BILITOT 0.7 05/25/2012 2236   GFRNONAA >60 09/06/2013 1405   GFRAA >60 09/06/2013 1405    No results found for: SPEP, UPEP  Lab Results  Component Value Date   WBC 6.7 08/28/2015   NEUTROABS 3.2 08/28/2015   HGB 13.9 08/28/2015   HCT 41.0 08/28/2015   MCV 89.6 08/28/2015   PLT 255 08/28/2015      Chemistry      Component Value Date/Time   NA 140 05/25/2012 2236   K 4.0 05/25/2012 2236   CL 105 05/25/2012 2236   CO2 24 05/25/2012 2236   BUN 9 05/25/2012 2236   CREATININE 0.98 09/06/2013 1405      Component Value Date/Time   CALCIUM 8.9 05/25/2012 2236   ALKPHOS 96 05/25/2012 2236   AST 21 05/25/2012 2236   ALT 29 05/25/2012 2236   BILITOT 0.7 05/25/2012 2236       RADIOGRAPHIC STUDIES: I have personally reviewed the radiological images as  listed and agreed with the findings in the report. No results found.   ASSESSMENT & PLAN:   # HEREDITARY HEMOCHROMATOSIS- abnormal 2 copies of C282Y gene mutation- without evidence of any significant end organ dysfunction [except imaging concerns of iron overload on MRI of the liver]. Patient is on periodic phlebotomies 200 mL with a goal ferritin less than 50.  Patient's CBC from yesterday shows hemoglobin 13.9/as a CBC normal; ferritin low at 17. This does not warrant any phlebotomy at this time.  # Patient is on low-dose Coumadin for his port; he  was given a new prescription for Coumadin.  # Patient will call for a port flush after his visit from [place] in April; he'll follow-up with me with labs; port flush; possible phlebotomy in 4 months from now.  # 15 minutes face-to-face with the patient discussing the above plan of care; more than 50% of time spent on prognosis/ natural history; counseling and coordination.      Earna Coder, MD 08/29/2015 9:49 AM

## 2015-09-04 ENCOUNTER — Ambulatory Visit: Payer: Medicare Other | Admitting: Internal Medicine

## 2015-11-27 ENCOUNTER — Telehealth: Payer: Self-pay | Admitting: *Deleted

## 2015-11-27 NOTE — Telephone Encounter (Signed)
Attempted to contact patient regarding follow up low dose CT scan for lung cancer screening. No active phone number for patient.

## 2015-12-18 ENCOUNTER — Telehealth: Payer: Self-pay | Admitting: *Deleted

## 2015-12-18 NOTE — Telephone Encounter (Signed)
Unable to successfully reach patient by phone. A letter has been sent informing patient it is time for their recommended follow up low dose CT scan for lung cancer screening. 

## 2015-12-19 ENCOUNTER — Other Ambulatory Visit: Payer: Medicare Other

## 2015-12-19 ENCOUNTER — Ambulatory Visit: Payer: Medicare Other | Admitting: Internal Medicine

## 2015-12-25 ENCOUNTER — Ambulatory Visit: Payer: Medicare Other | Admitting: Internal Medicine

## 2015-12-25 ENCOUNTER — Other Ambulatory Visit: Payer: Medicare Other

## 2015-12-26 ENCOUNTER — Inpatient Hospital Stay (HOSPITAL_BASED_OUTPATIENT_CLINIC_OR_DEPARTMENT_OTHER): Payer: Medicare Other | Admitting: Internal Medicine

## 2015-12-26 ENCOUNTER — Inpatient Hospital Stay: Payer: Medicare Other

## 2015-12-26 ENCOUNTER — Inpatient Hospital Stay: Payer: Medicare Other | Attending: Internal Medicine

## 2015-12-26 DIAGNOSIS — F17210 Nicotine dependence, cigarettes, uncomplicated: Secondary | ICD-10-CM | POA: Insufficient documentation

## 2015-12-26 DIAGNOSIS — M1990 Unspecified osteoarthritis, unspecified site: Secondary | ICD-10-CM | POA: Insufficient documentation

## 2015-12-26 DIAGNOSIS — Z95828 Presence of other vascular implants and grafts: Secondary | ICD-10-CM

## 2015-12-26 DIAGNOSIS — Z79899 Other long term (current) drug therapy: Secondary | ICD-10-CM | POA: Diagnosis not present

## 2015-12-26 DIAGNOSIS — Z7982 Long term (current) use of aspirin: Secondary | ICD-10-CM | POA: Insufficient documentation

## 2015-12-26 DIAGNOSIS — J439 Emphysema, unspecified: Secondary | ICD-10-CM | POA: Insufficient documentation

## 2015-12-26 LAB — COMPREHENSIVE METABOLIC PANEL
ALBUMIN: 3.9 g/dL (ref 3.5–5.0)
ALK PHOS: 87 U/L (ref 38–126)
ALT: 14 U/L — ABNORMAL LOW (ref 17–63)
ANION GAP: 5 (ref 5–15)
AST: 18 U/L (ref 15–41)
BUN: 13 mg/dL (ref 6–20)
CALCIUM: 9 mg/dL (ref 8.9–10.3)
CO2: 29 mmol/L (ref 22–32)
Chloride: 104 mmol/L (ref 101–111)
Creatinine, Ser: 0.98 mg/dL (ref 0.61–1.24)
GFR calc non Af Amer: >60 mL/min (ref >60)
GLUCOSE: 177 mg/dL — AB (ref 65–99)
POTASSIUM: 3.8 mmol/L (ref 3.5–5.1)
SODIUM: 138 mmol/L (ref 135–145)
TOTAL PROTEIN: 7.7 g/dL (ref 6.5–8.1)
Total Bilirubin: 1.4 mg/dL — ABNORMAL HIGH (ref 0.3–1.2)

## 2015-12-26 LAB — CBC WITH DIFFERENTIAL/PLATELET
BASOS PCT: 0 %
Basophils Absolute: 0 10*3/uL (ref 0–0.1)
EOS ABS: 0.2 10*3/uL (ref 0–0.7)
EOS PCT: 3 %
HCT: 39.4 % — ABNORMAL LOW (ref 40.0–52.0)
Hemoglobin: 13.4 g/dL (ref 13.0–18.0)
LYMPHS ABS: 2.6 10*3/uL (ref 1.0–3.6)
Lymphocytes Relative: 39 %
MCH: 31 pg (ref 26.0–34.0)
MCHC: 34.1 g/dL (ref 32.0–36.0)
MCV: 90.9 fL (ref 80.0–100.0)
MONO ABS: 0.4 10*3/uL (ref 0.2–1.0)
MONOS PCT: 6 %
Neutro Abs: 3.5 10*3/uL (ref 1.4–6.5)
Neutrophils Relative %: 52 %
Platelets: 222 10*3/uL (ref 150–440)
RBC: 4.33 MIL/uL — ABNORMAL LOW (ref 4.40–5.90)
RDW: 15.2 % — AB (ref 11.5–14.5)
WBC: 6.7 10*3/uL (ref 3.8–10.6)

## 2015-12-26 LAB — FERRITIN: Ferritin: 21 ng/mL — ABNORMAL LOW (ref 24–336)

## 2015-12-26 MED ORDER — WARFARIN SODIUM 1 MG PO TABS: 1.0000 mg | ORAL_TABLET | Freq: Every day | ORAL | Status: DC

## 2015-12-26 MED ORDER — SODIUM CHLORIDE 0.9% FLUSH
10.0000 mL | INTRAVENOUS | Status: DC | PRN
  Filled 2015-12-26: qty 10

## 2015-12-26 MED ORDER — HEPARIN SOD (PORK) LOCK FLUSH 100 UNIT/ML IV SOLN
500.0000 [IU] | Freq: Once | INTRAVENOUS | Status: DC
  Filled 2015-12-26: qty 5

## 2015-12-26 NOTE — Progress Notes (Signed)
Patient will like to notified re: his ferritin levels. His new phone number is [phone] [facility]([place] cell number)

## 2015-12-26 NOTE — Progress Notes (Signed)
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 However, [name] has not had phlebotomy in the last many months. [name] continues to have chronic arthritic symptoms/this is fairly unchanged.  He is currently spending most time in [place] with his son; [name] denies any bleeding issues. Appetite is good. Not losing any weight. His chronic cough.  No unusual cough or shortness of breath or chest pain.   REVIEW OF SYSTEMS:  A complete 10 point review of system is done which is negative except mentioned above/history of present illness.   PAST MEDICAL HISTORY :  Past Medical History  Diagnosis Date  . Hemochromatosis   . Arthritis   . Emphysema of lung (HCC)   . Spinal stenosis   . Personal history of tobacco use, presenting hazards to health 04/17/2015    PAST SURGICAL HISTORY :   Past Surgical History  Procedure Laterality Date  . Back surgery    . Esophagogastroduodenoscopy  05-24-2010; 06-24-2012  . Colonoscopy  05-24-2010; 06-24-2012    FAMILY HISTORY :  No family history on file.  SOCIAL HISTORY:   Social History  Substance Use Topics  . Smoking status: Current Every Day Smoker -- 2.00 packs/day for 50 years    Types: Cigarettes  . Smokeless tobacco: Not on file  . Alcohol Use: No    ALLERGIES:  is allergic to no known allergies.  MEDICATIONS:  Current Outpatient Prescriptions  Medication Sig Dispense Refill  . acetaminophen (TYLENOL) 500 MG tablet Take 500 mg by  mouth every 6 (six) hours as needed.    Marland Kitchen albuterol-ipratropium (COMBIVENT) 18-103 MCG/ACT inhaler Inhale 1 puff into the lungs 2 (two) times daily.    Marland Kitchen alprazolam (NIRAVAM) 2 MG dissolvable tablet Take 2 mg by mouth 3 (three) times daily.    Marland Kitchen aspirin 81 MG tablet Take 81 mg by mouth daily.    . Cholecalciferol 5000 UNITS TABS Take 1 tablet by mouth daily.    . citalopram (CELEXA) Evan MG tablet Take Evan mg by mouth 2 (two) times daily.    . Fluticasone-Salmeterol (ADVAIR) 250-50 MCG/DOSE AEPB Inhale 1 puff into the lungs 2 (two) times daily.    Marland Kitchen gabapentin (NEURONTIN) 600 MG tablet Take 600 mg by mouth 4 (four) times daily.    Marland Kitchen levalbuterol (XOPENEX HFA) 45 MCG/ACT inhaler Inhale into the lungs.    . metFORMIN (GLUCOPHAGE) 500 MG tablet Take 500 mg by mouth 2 (two) times daily with a meal.    . methadone (DOLOPHINE) 10 MG tablet Take 10 mg by mouth every 6 (six) hours as needed.    Marland Kitchen oxyCODONE (OXY IR/ROXICODONE) 5 MG immediate release tablet Take 10 mg by mouth every 4 (four) hours as needed for severe pain.     . pantoprazole (PROTONIX) Evan MG tablet Take Evan mg by mouth 2 (two) times daily.    . Pseudoephedrine-Ibuprofen 30-200 MG CAPS Take 2 capsules by mouth every 4 (four) hours as needed.    . simvastatin (ZOCOR) 20 MG tablet Take 20 mg by mouth daily.    Marland Kitchen  warfarin (COUMADIN) 1 MG tablet Take 1 tablet (1 mg total) by mouth daily. 30 tablet 3  . warfarin (COUMADIN) 1 MG tablet Take 1 tablet (1 mg total) by mouth daily. 30 tablet 3  . zolpidem (AMBIEN) 10 MG tablet Take 10 mg by mouth at bedtime as needed for sleep.     No current facility-administered medications for this visit.    PHYSICAL EXAMINATION:   BP 129/73 mmHg  Pulse 72  Temp(Src) 98.1 F (36.7 C) (Tympanic)  Resp 18  Wt 256 lb 2.8 oz (116.2 kg)  Filed Weights   12/26/15 1038  Weight: 256 lb 2.8 oz (116.2 kg)    GENERAL: Well-nourished well-developed; Alert, no distress and comfortable.    EYES: no pallor or  icterus OROPHARYNX: no thrush or ulceration;dentures.  NECK: supple, no masses felt LYMPH:  no palpable lymphadenopathy in the cervical, axillary or inguinal regions LUNGS: Decreased breath sounds to auscultation bilaterally and  No wheeze or crackles HEART/CVS: regular rate & rhythm and no murmurs; No lower extremity edema ABDOMEN:abdomen soft, non-tender and normal bowel sounds Musculoskeletal:no cyanosis of digits and no clubbing  PSYCH: alert & oriented x 3 with fluent speech NEURO: no focal motor/sensory deficits SKIN:  no rashes or significant lesions; Multiple tattoos noted.  LABORATORY DATA:  I have reviewed the data as listed    Component Value Date/Time   NA 140 05/25/2012 2236   K 4.0 05/25/2012 2236   CL 105 05/25/2012 2236   CO2 24 05/25/2012 2236   GLUCOSE 92 05/25/2012 2236   BUN 9 05/25/2012 2236   CREATININE 0.98 09/06/2013 1405   CALCIUM 8.9 05/25/2012 2236   PROT 8.0 05/25/2012 2236   ALBUMIN 4.0 05/25/2012 2236   AST 21 05/25/2012 2236   ALT 29 05/25/2012 2236   ALKPHOS 96 05/25/2012 2236   BILITOT 0.7 05/25/2012 2236   GFRNONAA >60 09/06/2013 1405   GFRAA >60 09/06/2013 1405    No results found for: SPEP, UPEP  Lab Results  Component Value Date   WBC 6.7 08/28/2015   NEUTROABS 3.2 08/28/2015   HGB 13.9 08/28/2015   HCT 41.0 08/28/2015   MCV 89.6 08/28/2015   PLT 255 08/28/2015      Chemistry      Component Value Date/Time   NA 140 05/25/2012 2236   K 4.0 05/25/2012 2236   CL 105 05/25/2012 2236   CO2 24 05/25/2012 2236   BUN 9 05/25/2012 2236   CREATININE 0.98 09/06/2013 1405      Component Value Date/Time   CALCIUM 8.9 05/25/2012 2236   ALKPHOS 96 05/25/2012 2236   AST 21 05/25/2012 2236   ALT 29 05/25/2012 2236   BILITOT 0.7 05/25/2012 2236      ASSESSMENT & PLAN:   # HEREDITARY HEMOCHROMATOSIS- abnormal 2 copies of C282Y gene mutation- without evidence of any significant end organ dysfunction [except imaging concerns of iron  overload on MRI of the liver]. [name] is on periodic phlebotomies 200 mL with a goal ferritin less than 50.  # Lab- pending today; Ferritin is greater than 50 recommend phlebotomy.  # Smoking/ Lung cancer screening- had previous screening scans; financial/insurance issues.   # [name] is on low-dose Coumadin for his port; he was given a new prescription for Coumadin.  # [name] will continue to get port flush every 2 months; follow-up with me in 6 months/possible phlebotomy-labs CBC and LFTs ; iron studies/ferritin few days prior.   # 15 minutes face-to-face with the [name] discussing  the above plan of care; more than 50% of time spent on prognosis/ natural history; counseling and coordination.     Earna Coder, MD 12/26/2015 10:46 AM

## 2015-12-26 NOTE — Patient Instructions (Signed)

## 2015-12-27 ENCOUNTER — Telehealth: Payer: Self-pay | Admitting: *Deleted

## 2015-12-27 NOTE — Telephone Encounter (Signed)
I left a msg on patient's cell phone for patient to contact the cancer center to discuss his lab results.  Home phone discontinue and no longer in service.

## 2015-12-27 NOTE — Telephone Encounter (Signed)
-----   Message from Earna CoderGovinda R Brahmanday, MD sent at 12/26/2015  4:30 PM EDT ----- Please inform patient that he does not need phlebotomy; follow up as planned. Thx

## 2016-01-09 ENCOUNTER — Telehealth: Payer: Self-pay | Admitting: Internal Medicine

## 2016-01-09 NOTE — Telephone Encounter (Signed)
Patient said he missed your call the other day when you were presumably calling re: lab results. [place] is in [place] now and so asks that you please try to call him again at: [phone]. Thanks!

## 2016-01-10 NOTE — Telephone Encounter (Signed)
01/10/16 @ 5:00--Attempted to call cell number provided several times but an automated message of "your call did not go thru please try again later"

## 2016-02-26 ENCOUNTER — Inpatient Hospital Stay: Payer: Medicare Other | Attending: Internal Medicine

## 2016-02-26 ENCOUNTER — Telehealth: Payer: Self-pay | Admitting: Internal Medicine

## 2016-02-26 DIAGNOSIS — Z95828 Presence of other vascular implants and grafts: Secondary | ICD-10-CM

## 2016-02-26 DIAGNOSIS — Z452 Encounter for adjustment and management of vascular access device: Secondary | ICD-10-CM | POA: Insufficient documentation

## 2016-02-26 MED ORDER — SODIUM CHLORIDE 0.9% FLUSH
10.0000 mL | Freq: Once | INTRAVENOUS | Status: DC
  Filled 2016-02-26: qty 10

## 2016-02-26 MED ORDER — SODIUM CHLORIDE 0.9% FLUSH
10.0000 mL | Freq: Once | INTRAVENOUS | Status: AC
  Administered 2016-02-26: 10 mL via INTRAVENOUS
  Filled 2016-02-26: qty 10

## 2016-02-26 MED ORDER — HEPARIN SOD (PORK) LOCK FLUSH 100 UNIT/ML IV SOLN
500.0000 [IU] | Freq: Once | INTRAVENOUS | Status: AC
  Administered 2016-02-26: 500 [IU] via INTRAVENOUS

## 2016-02-26 MED ORDER — HEPARIN SOD (PORK) LOCK FLUSH 100 UNIT/ML IV SOLN
INTRAVENOUS | Status: AC
  Filled 2016-02-26: qty 5

## 2016-02-26 NOTE — Telephone Encounter (Signed)
Patient called [place] this morning. Back from [place]. Was unable to understand some msgs you left him while there b/c bad phone connection. He is not sure if you need him to do labs again. He said he thought something was high on his last bloodwork. He is coming to [place] TODAY (02/26/16) for a flush at 1:45 and would like to know if he needs to also do labs or anything else while there. Please advise patient and schedulers. Thank you! (Patient left this number to call him back: [phone])

## 2016-02-27 NOTE — Telephone Encounter (Signed)
I do not recall calling this pt recently for any labs- Dr.B

## 2016-04-22 ENCOUNTER — Inpatient Hospital Stay: Payer: Medicare Other | Attending: Internal Medicine

## 2016-04-22 DIAGNOSIS — Z95828 Presence of other vascular implants and grafts: Secondary | ICD-10-CM

## 2016-04-22 DIAGNOSIS — Z452 Encounter for adjustment and management of vascular access device: Secondary | ICD-10-CM | POA: Insufficient documentation

## 2016-04-22 MED ORDER — SODIUM CHLORIDE 0.9% FLUSH
10.0000 mL | INTRAVENOUS | Status: DC | PRN
  Administered 2016-04-22: 10 mL via INTRAVENOUS
  Filled 2016-04-22: qty 10

## 2016-04-22 MED ORDER — HEPARIN SOD (PORK) LOCK FLUSH 100 UNIT/ML IV SOLN
500.0000 [IU] | Freq: Once | INTRAVENOUS | Status: AC
Start: 2016-04-22 — End: 2016-04-22
  Administered 2016-04-22: 500 [IU] via INTRAVENOUS

## 2016-04-29 ENCOUNTER — Inpatient Hospital Stay: Payer: Medicare Other

## 2016-06-21 ENCOUNTER — Other Ambulatory Visit: Payer: Medicare Other

## 2016-06-25 ENCOUNTER — Ambulatory Visit: Payer: Medicare Other | Admitting: Internal Medicine

## 2016-06-25 ENCOUNTER — Other Ambulatory Visit: Payer: Medicare Other

## 2016-09-06 ENCOUNTER — Other Ambulatory Visit: Payer: Self-pay | Admitting: *Deleted

## 2016-09-06 DIAGNOSIS — Z95828 Presence of other vascular implants and grafts: Secondary | ICD-10-CM

## 2016-09-06 MED ORDER — WARFARIN SODIUM 1 MG PO TABS: 1.0000 mg | ORAL_TABLET | Freq: Every day | ORAL | 0 refills | Status: DC

## 2016-09-06 NOTE — Telephone Encounter (Signed)
Called requesting refill on blood thinners. He has no showed for last 2 appts and has no fu scheduled for future. [place] in [place] and did not want to change doctors. He was last seen may 2017. Please advise

## 2016-09-06 NOTE — Telephone Encounter (Signed)
Per Dr Donneta RombergBrahmanday,  Give # 30 tabs and let patient know this is the last he will get unless he makes an appt to be seen here or [facility] [place] [facility] [facility]. Patient advised and asked it be [facility] [place] and also [place] and wants an appt for that date. I transferred him to the scheduler

## 2016-09-20 ENCOUNTER — Ambulatory Visit: Payer: Medicare Other | Admitting: Internal Medicine

## 2016-09-20 ENCOUNTER — Inpatient Hospital Stay: Payer: 59

## 2016-09-20 ENCOUNTER — Inpatient Hospital Stay: Payer: 59 | Attending: Internal Medicine

## 2016-09-20 ENCOUNTER — Inpatient Hospital Stay (HOSPITAL_BASED_OUTPATIENT_CLINIC_OR_DEPARTMENT_OTHER): Payer: 59 | Admitting: Internal Medicine

## 2016-09-20 DIAGNOSIS — M4800 Spinal stenosis, site unspecified: Secondary | ICD-10-CM | POA: Diagnosis not present

## 2016-09-20 DIAGNOSIS — Z87891 Personal history of nicotine dependence: Secondary | ICD-10-CM | POA: Insufficient documentation

## 2016-09-20 DIAGNOSIS — Z95828 Presence of other vascular implants and grafts: Secondary | ICD-10-CM

## 2016-09-20 DIAGNOSIS — M1990 Unspecified osteoarthritis, unspecified site: Secondary | ICD-10-CM | POA: Diagnosis not present

## 2016-09-20 LAB — CBC WITH DIFFERENTIAL/PLATELET
BASOS PCT: 0 %
Basophils Absolute: 0 10*3/uL (ref 0–0.1)
EOS ABS: 0.2 10*3/uL (ref 0–0.7)
Eosinophils Relative: 3 %
HCT: 39.7 % — ABNORMAL LOW (ref 40.0–52.0)
Hemoglobin: 14 g/dL (ref 13.0–18.0)
Lymphocytes Relative: 30 %
Lymphs Abs: 2.1 10*3/uL (ref 1.0–3.6)
MCH: 32.2 pg (ref 26.0–34.0)
MCHC: 35.3 g/dL (ref 32.0–36.0)
MCV: 91.4 fL (ref 80.0–100.0)
MONO ABS: 0.4 10*3/uL (ref 0.2–1.0)
Monocytes Relative: 5 %
NEUTROS PCT: 62 %
Neutro Abs: 4.4 10*3/uL (ref 1.4–6.5)
PLATELETS: 284 10*3/uL (ref 150–440)
RBC: 4.34 MIL/uL — ABNORMAL LOW (ref 4.40–5.90)
RDW: 15.3 % — ABNORMAL HIGH (ref 11.5–14.5)
WBC: 7.1 10*3/uL (ref 3.8–10.6)

## 2016-09-20 LAB — HEPATIC FUNCTION PANEL
ALT: 13 U/L — AB (ref 17–63)
AST: 19 U/L (ref 15–41)
Albumin: 3.7 g/dL (ref 3.5–5.0)
Alkaline Phosphatase: 85 U/L (ref 38–126)
BILIRUBIN INDIRECT: 0.9 mg/dL (ref 0.3–0.9)
Bilirubin, Direct: 0.2 mg/dL (ref 0.1–0.5)
TOTAL PROTEIN: 7.8 g/dL (ref 6.5–8.1)
Total Bilirubin: 1.1 mg/dL (ref 0.3–1.2)

## 2016-09-20 LAB — IRON AND TIBC
IRON: 64 ug/dL (ref 45–182)
Saturation Ratios: 24 % (ref 17.9–39.5)
TIBC: 262 ug/dL (ref 250–450)
UIBC: 198 ug/dL

## 2016-09-20 LAB — FERRITIN: FERRITIN: 28 ng/mL (ref 24–336)

## 2016-09-20 MED ORDER — WARFARIN SODIUM 1 MG PO TABS: 1.0000 mg | ORAL_TABLET | Freq: Every day | ORAL | 1 refills | Status: AC

## 2016-09-20 MED ORDER — HEPARIN SOD (PORK) LOCK FLUSH 100 UNIT/ML IV SOLN
500.0000 [IU] | Freq: Once | INTRAVENOUS | Status: AC
  Administered 2016-09-20: 500 [IU] via INTRAVENOUS

## 2016-09-20 MED ORDER — SODIUM CHLORIDE 0.9% FLUSH
10.0000 mL | INTRAVENOUS | Status: DC | PRN
  Administered 2016-09-20: 10 mL via INTRAVENOUS
  Filled 2016-09-20: qty 10

## 2016-09-20 NOTE — Progress Notes (Signed)
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 However, patient has not had phlebotomy in many months. Patient continues to have chronic arthritic symptoms, and peripheral neuropathy; this is fairly unchanged; being treated at pain clinic in [place].  Chronic nocturia, urinary frequency, abdominal tenderness, and insomnia, being followed by PCP.  He is currently spending most of his time in [place] with his son.  Patient denies any bleeding issues. Appetite is good. Not losing any weight. Has chronic cough, and shortness of breath with exertion.  No unusual cough, shortness of breath, or chest pain. No skin issues or swelling. Denies nausea, vomiting, constipation, diarrhea, hematuria, or hematochezia.  Patient is a long-time smoker, smoking approximately 2 packs/day for 50 years, enrolled in smoking cessation class with his wife in [place] starting next week.  He reports chronic chest tightness and "lungs feeling heavy".  REVIEW OF SYSTEMS:    Review of Systems  Constitutional: Negative for chills, fever and weight loss.  HENT: Negative for congestion.   Respiratory: Positive for cough and shortness of breath.   Cardiovascular: Negative for chest pain and leg swelling.  Gastrointestinal: Positive for abdominal pain. Negative for blood in stool, constipation, diarrhea, nausea and vomiting.  Genitourinary: Positive for frequency and urgency. Negative for hematuria.  Musculoskeletal: Positive for  joint pain. Negative for falls.  Skin: Negative.   Neurological: Positive for tingling and headaches.  Psychiatric/Behavioral: The patient is nervous/anxious and has insomnia.      PAST MEDICAL HISTORY :  Past Medical History:  Diagnosis Date  . Arthritis   . Emphysema of lung (HCC)   . Hemochromatosis   . Personal history of tobacco use, presenting hazards to health 04/17/2015  . Spinal stenosis     PAST SURGICAL HISTORY :   Past Surgical History:  Procedure Laterality Date  . BACK SURGERY    . COLONOSCOPY  05-24-2010; 06-24-2012  . ESOPHAGOGASTRODUODENOSCOPY  05-24-2010; 06-24-2012    FAMILY HISTORY :  No family history on file.  SOCIAL HISTORY:   Social History  Substance Use Topics  . Smoking status: Current Every Day Smoker    Packs/day: 2.00    Years: 50.00    Types: Cigarettes  . Smokeless tobacco: Not on file  . Alcohol use No    ALLERGIES:  is allergic to no known allergies.  MEDICATIONS:  Current Outpatient Prescriptions  Medication Sig Dispense Refill  . acetaminophen (TYLENOL) 500 MG tablet Take 500 mg by mouth every 6 (six) hours as needed.    Marland Kitchen albuterol-ipratropium (COMBIVENT) 18-103 MCG/ACT inhaler Inhale 1 puff into the lungs 2 (two) times daily.    Marland Kitchen alprazolam (XANAX) 2 MG tablet Take 2 mg by mouth.    Marland Kitchen aspirin 81 MG tablet Take 81 mg by mouth daily.    . Cholecalciferol 5000 UNITS TABS Take 1 tablet by mouth daily.    . citalopram (CELEXA) 40 MG tablet Take 40 mg by mouth 2 (two) times daily.    . Fluticasone-Salmeterol (ADVAIR) 250-50 MCG/DOSE AEPB Inhale 1 puff into the lungs 2 (two) times daily.    Marland Kitchen  gabapentin (NEURONTIN) 600 MG tablet Take 600 mg by mouth 4 (four) times daily.    Marland Kitchen. levalbuterol (XOPENEX HFA) 45 MCG/ACT inhaler Inhale into the lungs.    . metFORMIN (GLUCOPHAGE) 500 MG tablet Take 500 mg by mouth 2 (two) times daily with a meal.    . methadone (DOLOPHINE) 10 MG tablet Take 10 mg by mouth every 6 (six) hours as needed.    .  pantoprazole (PROTONIX) 40 MG tablet Take 40 mg by mouth 2 (two) times daily.    . simvastatin (ZOCOR) 20 MG tablet Take 20 mg by mouth daily.    Melene Muller. [START ON 10/03/2016] warfarin (COUMADIN) 1 MG tablet Take 1 tablet (1 mg total) by mouth daily. 30 tablet 1  . zolpidem (AMBIEN) 10 MG tablet Take 10 mg by mouth at bedtime as needed for sleep.     No current facility-administered medications for this visit.     PHYSICAL EXAMINATION:   BP 95/65 (BP Location: Left Arm, Patient Position: Sitting)   Pulse 76   Temp 98 F (36.7 C) (Tympanic)   Wt 249 lb 4 oz (113.1 kg)   BMI 35.26 kg/m   Filed Weights   09/20/16 0938  Weight: 249 lb 4 oz (113.1 kg)    GENERAL: Well-nourished well-developed; Alert, no distress and comfortable.    EYES: no pallor or icterus OROPHARYNX: no thrush or ulceration;dentures.  NECK: supple, no masses felt LYMPH:  no palpable lymphadenopathy in the cervical, axillary or inguinal regions LUNGS: Decreased breath sounds to auscultation bilaterally and  No wheeze or crackles HEART/CVS: regular rate & rhythm and no murmurs; No lower extremity edema ABDOMEN:abdomen soft, non-tender and normal bowel sounds Musculoskeletal:no cyanosis of digits and no clubbing  PSYCH: alert & oriented x 3 with fluent speech NEURO: no focal motor/sensory deficits SKIN:  no rashes or significant lesions; Multiple tattoos noted.  LABORATORY DATA:  I have reviewed the data as listed    Component Value Date/Time   NA 138 12/26/2015 1012   NA 140 05/25/2012 2236   K 3.8 12/26/2015 1012   K 4.0 05/25/2012 2236   CL 104 12/26/2015 1012   CL 105 05/25/2012 2236   CO2 29 12/26/2015 1012   CO2 24 05/25/2012 2236   GLUCOSE 177 (H) 12/26/2015 1012   GLUCOSE 92 05/25/2012 2236   BUN 13 12/26/2015 1012   BUN 9 05/25/2012 2236   CREATININE 0.98 12/26/2015 1012   CREATININE 0.98 09/06/2013 1405   CALCIUM 9.0 12/26/2015 1012   CALCIUM 8.9 05/25/2012 2236   PROT 7.8 09/20/2016 0910   PROT  8.0 05/25/2012 2236   ALBUMIN 3.7 09/20/2016 0910   ALBUMIN 4.0 05/25/2012 2236   AST 19 09/20/2016 0910   AST 21 05/25/2012 2236   ALT 13 (L) 09/20/2016 0910   ALT 29 05/25/2012 2236   ALKPHOS 85 09/20/2016 0910   ALKPHOS 96 05/25/2012 2236   BILITOT 1.1 09/20/2016 0910   BILITOT 0.7 05/25/2012 2236   GFRNONAA >60 12/26/2015 1012   GFRNONAA >60 09/06/2013 1405   GFRAA >60 12/26/2015 1012   GFRAA >60 09/06/2013 1405    No results found for: SPEP, UPEP  Lab Results  Component Value Date   WBC 7.1 09/20/2016   NEUTROABS 4.4 09/20/2016   HGB 14.0 09/20/2016   HCT 39.7 (L) 09/20/2016   MCV 91.4 09/20/2016   PLT 284 09/20/2016      Chemistry      Component Value Date/Time   NA 138 12/26/2015 1012  NA 140 05/25/2012 2236   K 3.8 12/26/2015 1012   K 4.0 05/25/2012 2236   CL 104 12/26/2015 1012   CL 105 05/25/2012 2236   CO2 29 12/26/2015 1012   CO2 24 05/25/2012 2236   BUN 13 12/26/2015 1012   BUN 9 05/25/2012 2236   CREATININE 0.98 12/26/2015 1012   CREATININE 0.98 09/06/2013 1405      Component Value Date/Time   CALCIUM 9.0 12/26/2015 1012   CALCIUM 8.9 05/25/2012 2236   ALKPHOS 85 09/20/2016 0910   ALKPHOS 96 05/25/2012 2236   AST 19 09/20/2016 0910   AST 21 05/25/2012 2236   ALT 13 (L) 09/20/2016 0910   ALT 29 05/25/2012 2236   BILITOT 1.1 09/20/2016 0910   BILITOT 0.7 05/25/2012 2236      ASSESSMENT & PLAN:   Hemochromatosis # HEREDITARY HEMOCHROMATOSIS- abnormal 2 copies of C282Y gene mutation- without evidence of any significant end organ dysfunction [except imaging concerns of iron overload on MRI of the liver]. Patient is on periodic phlebotomies 200 mL with a goal ferritin less than 50.  # Lab- pending today; if ferritin is greater than 50, recommend phlebotomy.  # Smoking/ Lung cancer screening- had previous screening scans; financial/insurance issues. Discussed plan for smoking cessation class starting next week in [place]. Recommend follow up  with PCP.  # Patient is on low-dose Coumadin for his port; he was given refill on prescription for Coumadin.  # patient will continue to get port flush every 2 months; follow-up with me in 6 months/possible phlebotomy-labs CBC and LFTs ; iron studies/ferritin a few days prior.     Linna Darner, NP  09/20/16 4:47 PM

## 2016-09-20 NOTE — Assessment & Plan Note (Addendum)
#   HEREDITARY HEMOCHROMATOSIS- abnormal 2 copies of C282Y gene mutation- without evidence of any significant end organ dysfunction [except imaging concerns of iron overload on MRI of the liver]. Patient is on periodic phlebotomies 200 mL with a goal ferritin less than 50.  # Lab- pending today; if ferritin is greater than 50, recommend phlebotomy.  # Smoking/ Lung cancer screening- had previous screening scans; financial/insurance issues. Discussed plan for [place] cessation class [place]. Recommend follow up with PCP.  # Patient is on low-dose Coumadin for his port; he was given refill on prescription for Coumadin.  # patient will continue to get port flush every 2 months; follow-up with me in 6 months/possible phlebotomy-labs CBC and LFTs ; iron studies/ferritin a few days prior.

## 2016-09-20 NOTE — Progress Notes (Signed)
Patient here today for follow up.  Patient states no new concerns today  

## 2016-11-18 ENCOUNTER — Inpatient Hospital Stay: Payer: Medicare PPO | Attending: Internal Medicine

## 2017-03-18 ENCOUNTER — Other Ambulatory Visit: Payer: 59

## 2017-03-19 ENCOUNTER — Inpatient Hospital Stay: Payer: Medicare PPO | Attending: Internal Medicine

## 2017-03-20 ENCOUNTER — Ambulatory Visit: Payer: 59 | Admitting: Oncology

## 2017-03-21 ENCOUNTER — Inpatient Hospital Stay: Payer: Medicare PPO

## 2017-03-21 ENCOUNTER — Inpatient Hospital Stay: Payer: Medicare PPO | Admitting: Internal Medicine

## 2017-03-21 NOTE — Progress Notes (Deleted)
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 However, Patient has not had phlebotomy in the last many months. Patient continues to have chronic arthritic symptoms/this is fairly unchanged.  He is currently spending most time in [place] with his son; Patient denies any bleeding issues. Appetite is good. Not losing any weight. His chronic cough.  No unusual cough or shortness of breath or chest pain.   REVIEW OF SYSTEMS:  A complete 10 point review of system is done which is negative except mentioned above/history of present illness.   PAST MEDICAL HISTORY :  Past Medical History:  Diagnosis Date  . Arthritis   . Emphysema of lung (HCC)   . Hemochromatosis   . Personal history of tobacco use, presenting hazards to health 04/17/2015  . Spinal stenosis     PAST SURGICAL HISTORY :   Past Surgical History:  Procedure Laterality Date  . BACK SURGERY    . COLONOSCOPY  05-24-2010; 06-24-2012  . ESOPHAGOGASTRODUODENOSCOPY  05-24-2010; 06-24-2012    FAMILY HISTORY :  No family history on file.  SOCIAL HISTORY:   Social History  Substance Use Topics  . Smoking status: Current Every Day Smoker    Packs/day: 2.00    Years: 50.00    Types: Cigarettes  . Smokeless tobacco: Not on file  . Alcohol use No    ALLERGIES:  is allergic to no known allergies.  MEDICATIONS:  Current Outpatient Prescriptions  Medication Sig Dispense Refill  . acetaminophen (TYLENOL) 500 MG tablet Take 500 mg  by mouth every 6 (six) hours as needed.    Marland Kitchen albuterol-ipratropium (COMBIVENT) 18-103 MCG/ACT inhaler Inhale 1 puff into the lungs 2 (two) times daily.    Marland Kitchen alprazolam (XANAX) 2 MG tablet Take 2 mg by mouth.    Marland Kitchen aspirin 81 MG tablet Take 81 mg by mouth daily.    . Cholecalciferol 5000 UNITS TABS Take 1 tablet by mouth daily.    . citalopram (CELEXA) 40 MG tablet Take 40 mg by mouth 2 (two) times daily.    . Fluticasone-Salmeterol (ADVAIR) 250-50 MCG/DOSE AEPB Inhale 1 puff into the lungs 2 (two) times daily.    Marland Kitchen gabapentin (NEURONTIN) 600 MG tablet Take 600 mg by mouth 4 (four) times daily.    Marland Kitchen levalbuterol (XOPENEX HFA) 45 MCG/ACT inhaler Inhale into the lungs.    . metFORMIN (GLUCOPHAGE) 500 MG tablet Take 500 mg by mouth 2 (two) times daily with a meal.    . methadone (DOLOPHINE) 10 MG tablet Take 10 mg by mouth every 6 (six) hours as needed.    . pantoprazole (PROTONIX) 40 MG tablet Take 40 mg by mouth 2 (two) times daily.    . simvastatin (ZOCOR) 20 MG tablet Take 20 mg by mouth daily.    Marland Kitchen warfarin (COUMADIN) 1 MG tablet Take 1 tablet (1 mg total) by mouth daily. 30 tablet 1  . zolpidem (AMBIEN) 10 MG tablet Take 10 mg by mouth at bedtime as needed for sleep.     No current facility-administered medications for this visit.  PHYSICAL EXAMINATION:   There were no vitals taken for this visit.  There were no vitals filed for this visit.  GENERAL: Well-nourished well-developed; Alert, no distress and comfortable.    EYES: no pallor or icterus OROPHARYNX: no thrush or ulceration;dentures.  NECK: supple, no masses felt LYMPH:  no palpable lymphadenopathy in the cervical, axillary or inguinal regions LUNGS: Decreased breath sounds to auscultation bilaterally and  No wheeze or crackles HEART/CVS: regular rate & rhythm and no murmurs; No lower extremity edema ABDOMEN:abdomen soft, non-tender and normal bowel sounds Musculoskeletal:no cyanosis of digits and no clubbing  PSYCH:  alert & oriented x 3 with fluent speech NEURO: no focal motor/sensory deficits SKIN:  no rashes or significant lesions; Multiple tattoos noted.  LABORATORY DATA:  I have reviewed the data as listed    Component Value Date/Time   NA 138 12/26/2015 1012   NA 140 05/25/2012 2236   K 3.8 12/26/2015 1012   K 4.0 05/25/2012 2236   CL 104 12/26/2015 1012   CL 105 05/25/2012 2236   CO2 29 12/26/2015 1012   CO2 24 05/25/2012 2236   GLUCOSE 177 (H) 12/26/2015 1012   GLUCOSE 92 05/25/2012 2236   BUN 13 12/26/2015 1012   BUN 9 05/25/2012 2236   CREATININE 0.98 12/26/2015 1012   CREATININE 0.98 09/06/2013 1405   CALCIUM 9.0 12/26/2015 1012   CALCIUM 8.9 05/25/2012 2236   PROT 7.8 09/20/2016 0910   PROT 8.0 05/25/2012 2236   ALBUMIN 3.7 09/20/2016 0910   ALBUMIN 4.0 05/25/2012 2236   AST 19 09/20/2016 0910   AST 21 05/25/2012 2236   ALT 13 (L) 09/20/2016 0910   ALT 29 05/25/2012 2236   ALKPHOS 85 09/20/2016 0910   ALKPHOS 96 05/25/2012 2236   BILITOT 1.1 09/20/2016 0910   BILITOT 0.7 05/25/2012 2236   GFRNONAA >60 12/26/2015 1012   GFRNONAA >60 09/06/2013 1405   GFRAA >60 12/26/2015 1012   GFRAA >60 09/06/2013 1405    No results found for: SPEP, UPEP  Lab Results  Component Value Date   WBC 7.1 09/20/2016   NEUTROABS 4.4 09/20/2016   HGB 14.0 09/20/2016   HCT 39.7 (L) 09/20/2016   MCV 91.4 09/20/2016   PLT 284 09/20/2016      Chemistry      Component Value Date/Time   NA 138 12/26/2015 1012   NA 140 05/25/2012 2236   K 3.8 12/26/2015 1012   K 4.0 05/25/2012 2236   CL 104 12/26/2015 1012   CL 105 05/25/2012 2236   CO2 29 12/26/2015 1012   CO2 24 05/25/2012 2236   BUN 13 12/26/2015 1012   BUN 9 05/25/2012 2236   CREATININE 0.98 12/26/2015 1012   CREATININE 0.98 09/06/2013 1405      Component Value Date/Time   CALCIUM 9.0 12/26/2015 1012   CALCIUM 8.9 05/25/2012 2236   ALKPHOS 85 09/20/2016 0910   ALKPHOS 96 05/25/2012 2236   AST 19 09/20/2016 0910   AST 21  05/25/2012 2236   ALT 13 (L) 09/20/2016 0910   ALT 29 05/25/2012 2236   BILITOT 1.1 09/20/2016 0910   BILITOT 0.7 05/25/2012 2236      ASSESSMENT & PLAN:   No problem-specific Assessment & Plan notes found for this encounter.       Earna Coder, MD 03/21/2017 8:25 AM

## 2017-03-21 NOTE — Assessment & Plan Note (Deleted)
#   HEREDITARY HEMOCHROMATOSIS- abnormal 2 copies of C282Y gene mutation- without evidence of any significant end organ dysfunction [except imaging concerns of iron overload on MRI of the liver]. Patient is on periodic phlebotomies 200 mL with a goal ferritin less than 50.  # Lab- pending today; Ferritin is greater than 50 recommend phlebotomy.  # Smoking/ Lung cancer screening- had previous screening scans; financial/insurance issues.   # Patient is on low-dose Coumadin for his port; he was given a new prescription for Coumadin.  # patient will continue to get port flush every 2 months; follow-up with me in 6 months/possible phlebotomy-labs CBC and LFTs ; iron studies/ferritin few days prior.   # 15 minutes face-to-face with the patient discussing the above plan of care; more than 50% of time spent on prognosis/ natural history; counseling and coordination

## 2017-06-05 DEATH — deceased
